# Patient Record
Sex: Female | Born: 1965 | Race: White | Hispanic: No | Marital: Married | Smoking: Never smoker
Health system: Southern US, Community
[De-identification: ages and names within clinical notes are randomized; demographics above are authoritative.]

## PROBLEM LIST (undated history)

## (undated) DIAGNOSIS — K219 Gastro-esophageal reflux disease without esophagitis: Secondary | ICD-10-CM

## (undated) HISTORY — PX: FOOT SURGERY: SHX648

## (undated) HISTORY — PX: CHOLECYSTECTOMY: SHX55

## (undated) HISTORY — PX: OTHER SURGICAL HISTORY: SHX169

## (undated) HISTORY — PX: BREAST SURGERY: SHX581

## (undated) HISTORY — PX: NECK SURGERY: SHX720

---

## 2011-10-24 ENCOUNTER — Emergency Department
Admission: EM | Admit: 2011-10-24 | Discharge: 2011-10-24 | Disposition: A | Discharge status: Home / Self Care | Payer: BC Managed Care – PPO | Source: Home / Self Care | Attending: Family Medicine | Admitting: Family Medicine

## 2011-10-24 DIAGNOSIS — H6691 Otitis media, unspecified, right ear: Secondary | ICD-10-CM

## 2011-10-24 DIAGNOSIS — J069 Acute upper respiratory infection, unspecified: Secondary | ICD-10-CM

## 2011-10-24 DIAGNOSIS — H6690 Otitis media, unspecified, unspecified ear: Secondary | ICD-10-CM

## 2011-10-24 DIAGNOSIS — J0100 Acute maxillary sinusitis, unspecified: Secondary | ICD-10-CM

## 2011-10-24 HISTORY — DX: Gastro-esophageal reflux disease without esophagitis: K21.9

## 2011-10-24 MED ORDER — BENZONATATE 200 MG PO CAPS: 200.0000 mg | ORAL_CAPSULE | Freq: Every day | ORAL | Status: AC

## 2011-10-24 MED ORDER — AMOXICILLIN 875 MG PO TABS: 875.0000 mg | ORAL_TABLET | Freq: Two times a day (BID) | ORAL | Status: AC

## 2011-10-24 NOTE — ED Provider Notes (Signed)
History     CSN: [id number] Arrival date & time: 10/24/2011  2:55 PM   First MD Initiated Contact with Patient 10/24/11 1532      Chief Complaint  Patient presents with  . URI  . Otalgia      HPI Comments: Patient complains of approximately 3 week history of gradually progressive URI symptoms beginning with a mild cough followed by progressive nasal congestion. The cough lasted 2 weeks and she then seemed well.  4 days ago she developed a sore throat that lasted two days, followed by sinus congestion and facial pain.  She has now had a right earache for two days.  Patient is a 45 y.o. female presenting with URI. The history is provided by the patient.  URI The primary symptoms include fatigue, ear pain, cough and myalgias. Primary symptoms do not include fever, headaches, sore throat, swollen glands, wheezing, abdominal pain, nausea, vomiting, arthralgias or rash. The current episode started more than 1 week ago. This is a new problem.  Symptoms associated with the illness include chills, plugged ear sensation, facial pain, sinus pressure, congestion and rhinorrhea.    Past Medical History  Diagnosis Date  . GERD (gastroesophageal reflux disease)     Past Surgical History  Procedure Date  . Breast surgery     Family History  Problem Relation Age of Onset  . Cancer Mother     lung  . Hypertension Mother   . Hyperlipidemia Sister     History  Substance Use Topics  . Smoking status: Never Smoker   . Smokeless tobacco: Not on file  . Alcohol Use: Yes    OB History    Grav Para Term Preterm Abortions TAB SAB Ect Mult Living                  Review of Systems  Constitutional: Positive for chills and fatigue. Negative for fever.  HENT: Positive for ear pain, congestion, rhinorrhea and sinus pressure. Negative for nosebleeds, sore throat and ear discharge.   Eyes: Negative.   Respiratory: Positive for cough. Negative for wheezing.        She reports that cough is  now minimal.  Cardiovascular: Negative.   Gastrointestinal: Negative.  Negative for nausea, vomiting and abdominal pain.  Genitourinary: Negative.   Musculoskeletal: Positive for myalgias. Negative for arthralgias.  Skin: Negative.  Negative for rash.  Neurological: Negative for headaches.    Allergies  Shellfish allergy and Sulfa antibiotics  Home Medications   Current Outpatient Rx  Name Route Sig Dispense Refill  . PANTOPRAZOLE SODIUM 40 MG PO TBEC Oral Take 40 mg by mouth daily.      Marland Kitchen VITAMIN B-12 500 MCG PO TABS Oral Take 500 mcg by mouth daily.      . AMOXICILLIN 875 MG PO TABS Oral Take 1 tablet (875 mg total) by mouth 2 (two) times daily. 20 tablet 0  . BENZONATATE 200 MG PO CAPS Oral Take 1 capsule (200 mg total) by mouth at bedtime. Take as needed for cough 12 capsule 0    BP 153/82  Pulse 81  Temp(Src) 98.3 F (36.8 C) (Oral)  Resp 20  Ht 5' 1.5" (1.562 m)  Wt 173 lb (78.472 kg)  BMI 32.16 kg/m2  SpO2 99%  LMP 10/19/2011  Physical Exam  Nursing note and vitals reviewed. Constitutional: She is oriented to person, place, and time. She appears well-developed and well-nourished. No distress.       Patient is obese (BMI 32.2 )  HENT:  Head: Normocephalic and atraumatic.  Right Ear: External ear and ear canal normal. Tympanic membrane is erythematous.  Left Ear: Tympanic membrane, external ear and ear canal normal.  Nose: Mucosal edema and rhinorrhea present. Right sinus exhibits maxillary sinus tenderness. Left sinus exhibits maxillary sinus tenderness.  Mouth/Throat: Oropharynx is clear and moist. No oral lesions. No oropharyngeal exudate.  Eyes: Right eye exhibits no discharge. Left eye exhibits no discharge. No scleral icterus.  Neck: Neck supple.  Cardiovascular: Normal rate, regular rhythm and normal heart sounds.   Pulmonary/Chest: Effort normal and breath sounds normal. She has no wheezes. She has no rales.  Lymphadenopathy:    She has no cervical  adenopathy.  Neurological: She is alert and oriented to person, place, and time.  Skin: Skin is warm and dry.    ED Course  Procedures None      1. Otitis media of right ear   2. Acute maxillary sinusitis   3. Acute upper respiratory infections of unspecified site       MDM  Suspect viral URI with secondary bacterial otitis media and sinusitis Begin Amoxicillin.  Begin expectorant/decongestant, topical decongestant, saline nasal spray and/or saline irrigation, and cough suppressant at bedtime.  Avoid antihistamines for now. Increase fluid intake, rest. May take Ibuprofen 200mg , 4 tabs every 8 hours with food for discomfort. Followup with PCP if not improving 7 days.         Donna Christen, MD 10/27/11 [phone]

## 2011-10-24 NOTE — ED Notes (Signed)
States upper respiratory symptoms started 14 days ago, on Tuesday started with sinus pressure and right ear pain

## 2017-08-09 ENCOUNTER — Inpatient Hospital Stay
Admission: AD | Admit: 2017-08-09 | Discharge: 2017-08-27 | Disposition: A | Discharge status: Rehabilitation Facility | Payer: Self-pay | Source: Other Acute Inpatient Hospital | Attending: Internal Medicine | Admitting: Internal Medicine

## 2017-08-09 DIAGNOSIS — T85598A Other mechanical complication of other gastrointestinal prosthetic devices, implants and grafts, initial encounter: Secondary | ICD-10-CM

## 2017-08-09 DIAGNOSIS — Z4659 Encounter for fitting and adjustment of other gastrointestinal appliance and device: Secondary | ICD-10-CM

## 2017-08-09 DIAGNOSIS — R509 Fever, unspecified: Secondary | ICD-10-CM

## 2017-08-10 ENCOUNTER — Other Ambulatory Visit (HOSPITAL_COMMUNITY): Payer: Self-pay

## 2017-08-10 LAB — BASIC METABOLIC PANEL
Anion gap: 10 (ref 5–15)
BUN: 24 mg/dL — AB (ref 6–20)
CO2: 23 mmol/L (ref 22–32)
Calcium: 9.1 mg/dL (ref 8.9–10.3)
Chloride: 105 mmol/L (ref 101–111)
Creatinine, Ser: 0.51 mg/dL (ref 0.44–1.00)
GFR calc Af Amer: >60 mL/min (ref >60)
GFR calc non Af Amer: >60 mL/min (ref >60)
GLUCOSE: 94 mg/dL (ref 65–99)
POTASSIUM: 4 mmol/L (ref 3.5–5.1)
Sodium: 138 mmol/L (ref 135–145)

## 2017-08-10 LAB — CBC
HCT: 29.2 % — ABNORMAL LOW (ref 36.0–46.0)
HEMOGLOBIN: 8.9 g/dL — AB (ref 12.0–15.0)
MCH: 29.9 pg (ref 26.0–34.0)
MCHC: 30.5 g/dL (ref 30.0–36.0)
MCV: 98 fL (ref 78.0–100.0)
Platelets: 466 10*3/uL — ABNORMAL HIGH (ref 150–400)
RBC: 2.98 MIL/uL — AB (ref 3.87–5.11)
RDW: 14.4 % (ref 11.5–15.5)
WBC: 7.5 10*3/uL (ref 4.0–10.5)

## 2017-08-10 IMAGING — DX DG ABD PORTABLE 1V
2 series · 2 of 2 positions shown · non-contrast
Comparison: None.

CLINICAL DATA: Impaired nasogastric feeding tube

EXAM:
PORTABLE ABDOMEN - 1 VIEW

[abdomen kub (1 of 2)]
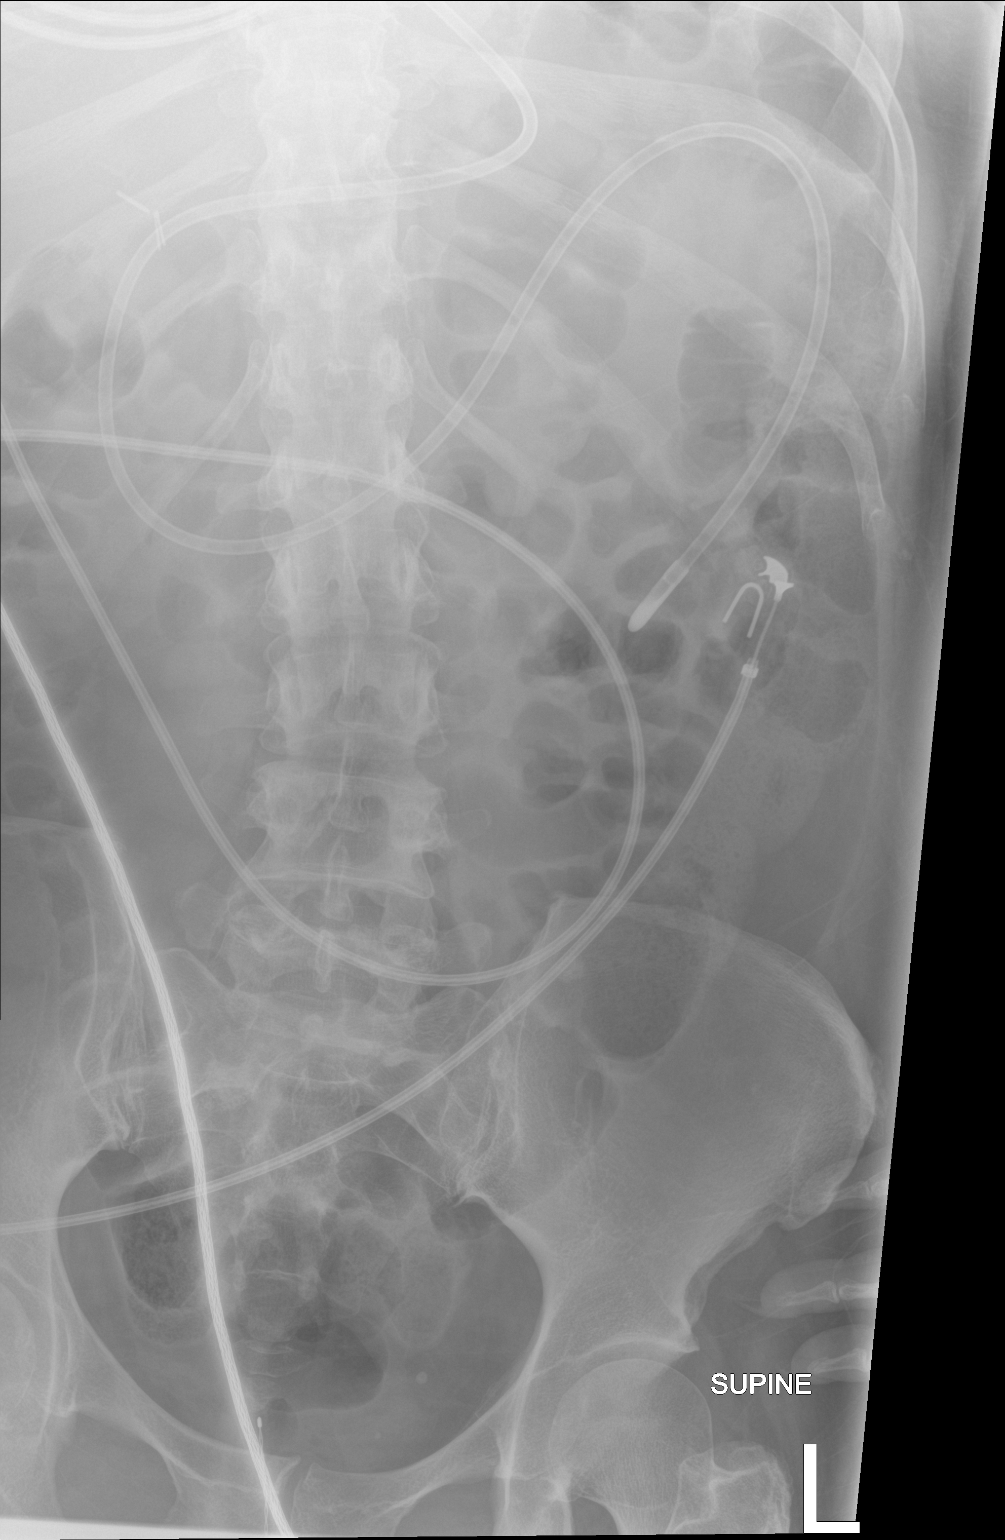

[abdomen kub (2 of 2)]
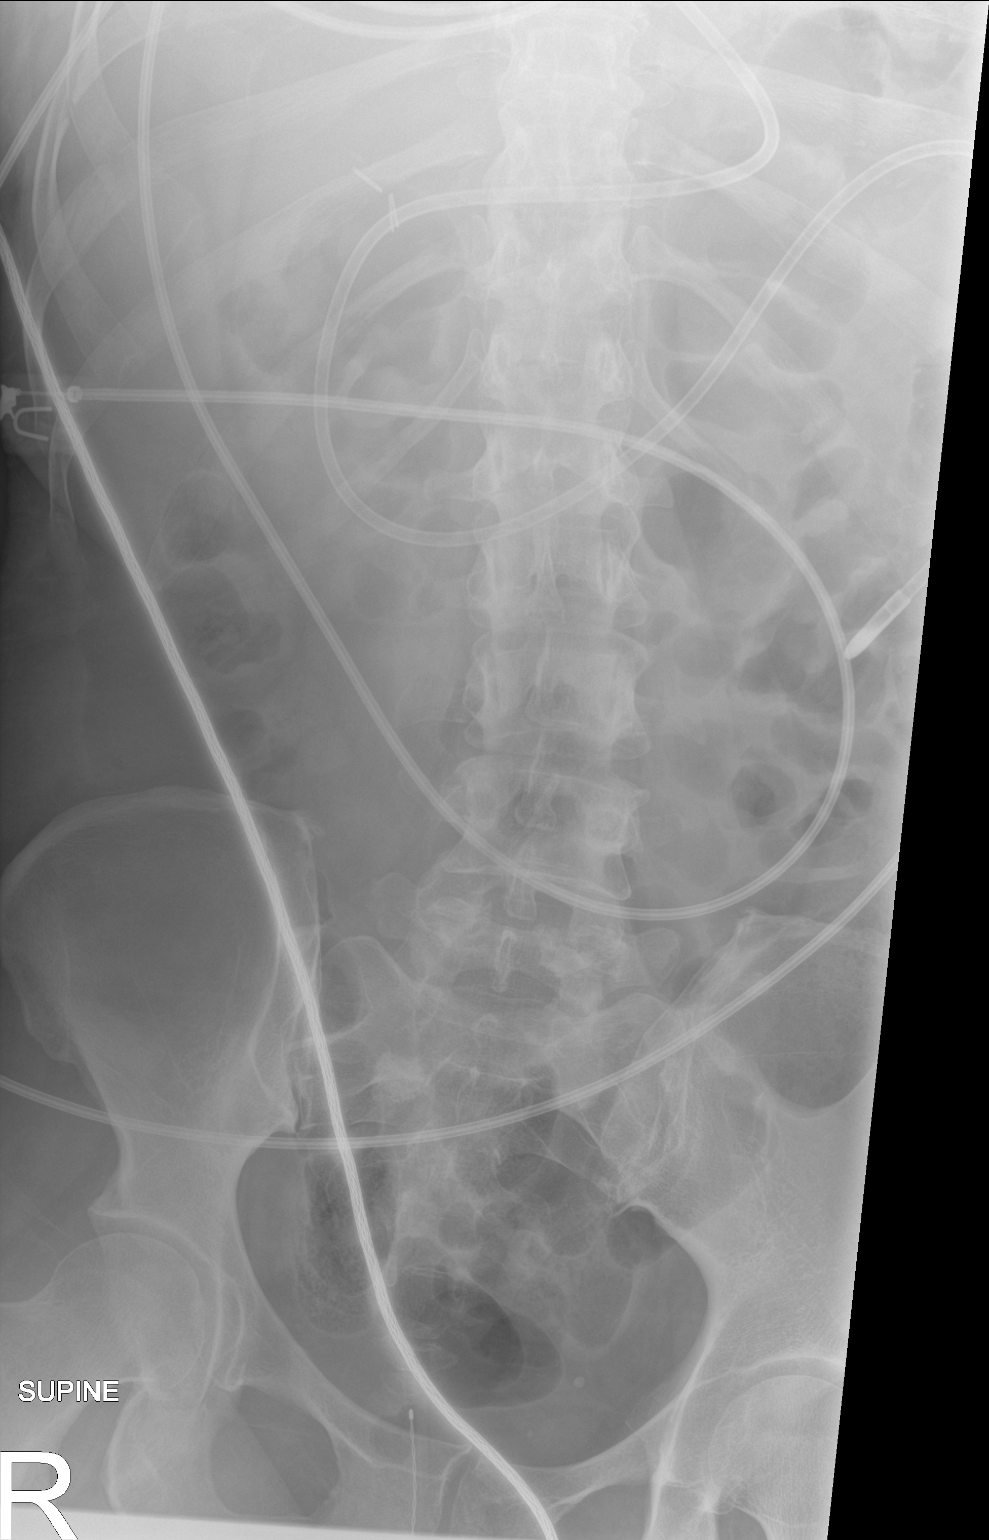

[2 of 2 positions shown; findings below may reference images not displayed]

FINDINGS: Enteric feeding tube is present with tip in the left mid abdomen
consistent with location in the proximal jejunum. Gas-filled colon
without distention. No small bowel distention. No radiopaque stones.
Surgical clips in the right upper quadrant.
IMPRESSION: Enteric feeding tube tip is in the left mid abdomen consistent with
location in the proximal jejunum.

## 2017-08-12 ENCOUNTER — Other Ambulatory Visit (HOSPITAL_COMMUNITY): Payer: Self-pay

## 2017-08-12 LAB — URINALYSIS, ROUTINE W REFLEX MICROSCOPIC
Bilirubin Urine: NEGATIVE
Glucose, UA: NEGATIVE mg/dL
Ketones, ur: NEGATIVE mg/dL
NITRITE: NEGATIVE
Protein, ur: 100 mg/dL — AB
Specific Gravity, Urine: 1.029 (ref 1.005–1.030)
pH: 5 (ref 5.0–8.0)

## 2017-08-12 IMAGING — DX DG CHEST 1V PORT
1 series · 1 of 1 positions shown · non-contrast
Comparison: None.

CLINICAL DATA: Fever.

EXAM:
PORTABLE CHEST 1 VIEW

[chest ap]
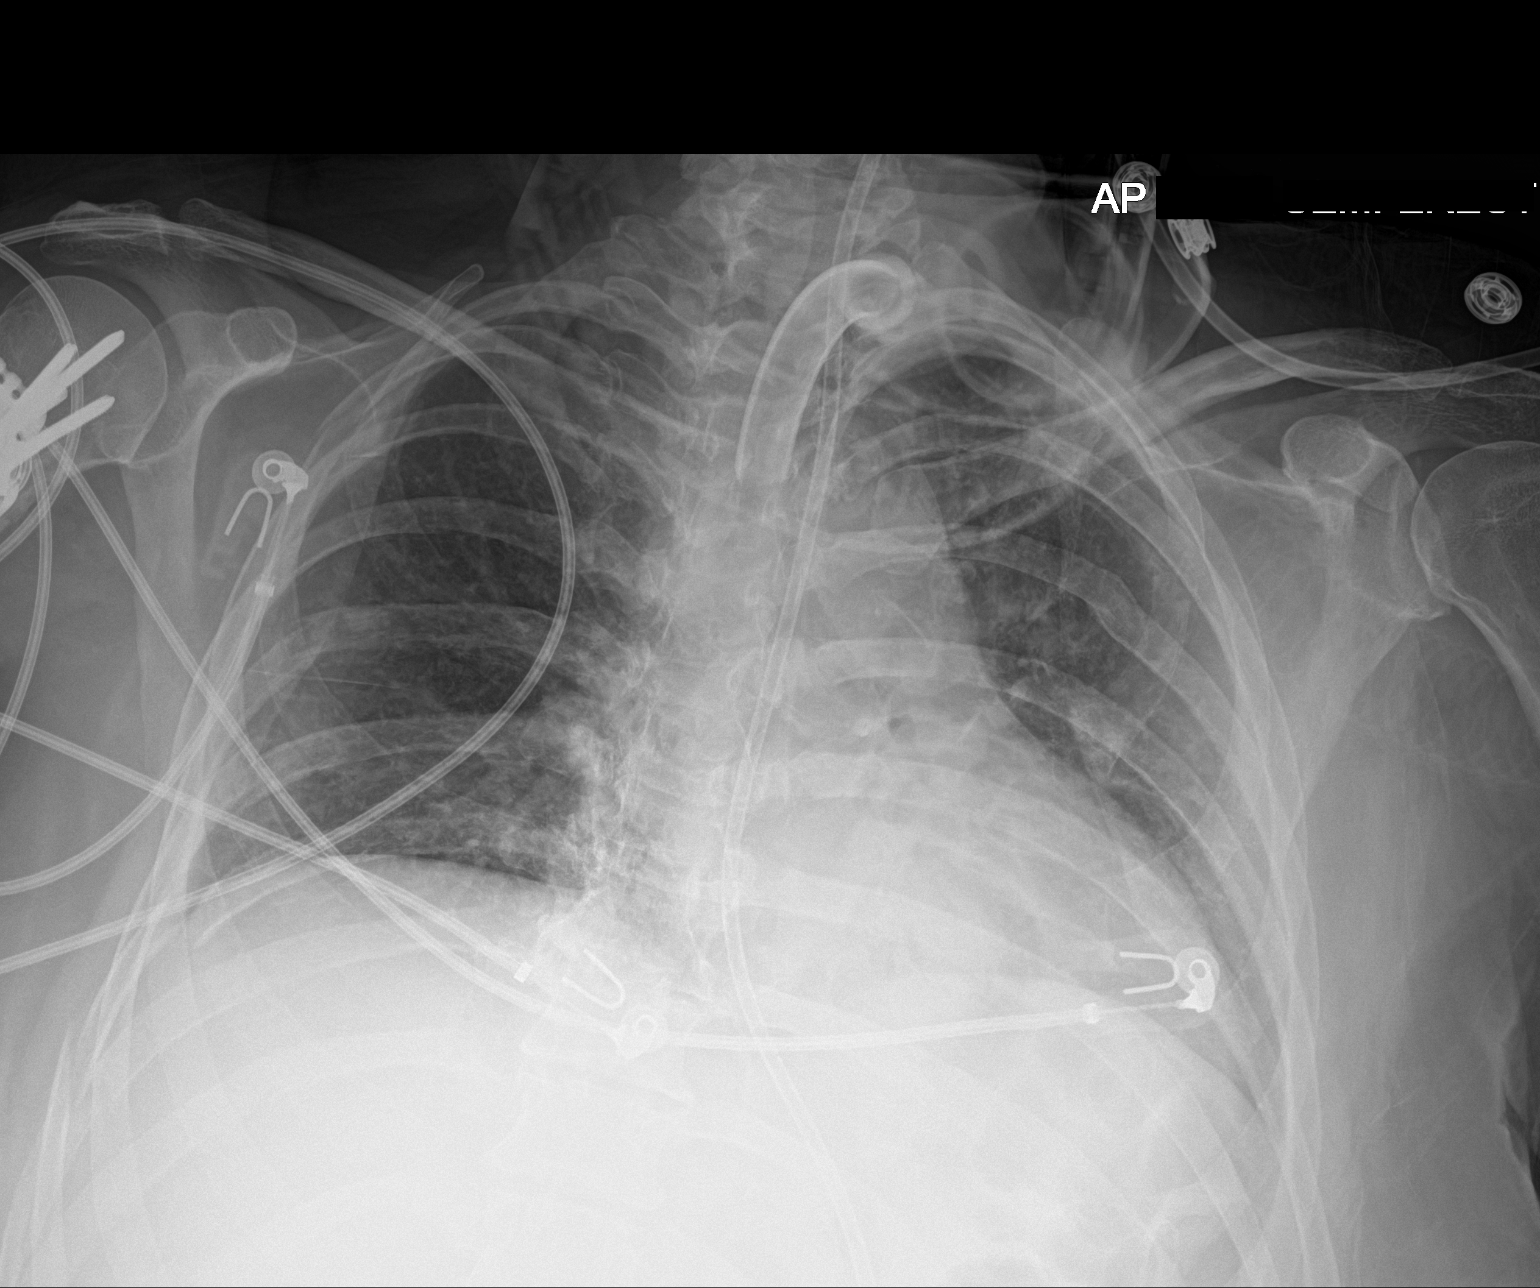

[1 of 1 positions shown; findings below may reference images not displayed]

FINDINGS: Tracheostomy tube tip projects 2.6 cm above the carina. Feeding tube
past proximal stomach, distal tip not included. Cardiomediastinal
silhouette is unremarkable for this low inspiratory examination with
crowded vasculature markings. Retrocardiac consolidation with air
bronchograms. Trace LEFT pleural effusion. Trachea projects midline
and there is no pneumothorax. Numerous acute mildly displaced RIGHT
rib fractures. Old LEFT rib fractures. RIGHT humerus ORIF.
IMPRESSION: Retrocardiac atelectasis or pneumonia. Trace LEFT pleural effusion
suspected.

Multiple acute RIGHT rib fractures.

## 2017-08-13 LAB — BASIC METABOLIC PANEL
ANION GAP: 7 (ref 5–15)
BUN: 17 mg/dL (ref 6–20)
CHLORIDE: 107 mmol/L (ref 101–111)
CO2: 23 mmol/L (ref 22–32)
CREATININE: 0.44 mg/dL (ref 0.44–1.00)
Calcium: 8.3 mg/dL — ABNORMAL LOW (ref 8.9–10.3)
GFR calc Af Amer: >60 mL/min (ref >60)
Glucose, Bld: 114 mg/dL — ABNORMAL HIGH (ref 65–99)
POTASSIUM: 3.4 mmol/L — AB (ref 3.5–5.1)
SODIUM: 137 mmol/L (ref 135–145)

## 2017-08-13 LAB — CBC
HEMATOCRIT: 26.9 % — AB (ref 36.0–46.0)
Hemoglobin: 8.3 g/dL — ABNORMAL LOW (ref 12.0–15.0)
MCH: 29.9 pg (ref 26.0–34.0)
MCHC: 30.9 g/dL (ref 30.0–36.0)
MCV: 96.8 fL (ref 78.0–100.0)
Platelets: 417 10*3/uL — ABNORMAL HIGH (ref 150–400)
RBC: 2.78 MIL/uL — AB (ref 3.87–5.11)
RDW: 14.7 % (ref 11.5–15.5)
WBC: 9.6 10*3/uL (ref 4.0–10.5)

## 2017-08-14 LAB — POTASSIUM: POTASSIUM: 4 mmol/L (ref 3.5–5.1)

## 2017-08-14 LAB — VANCOMYCIN, TROUGH: VANCOMYCIN TR: 7 ug/mL — AB (ref 15–20)

## 2017-08-15 LAB — URINE CULTURE: Culture: >=100000 — AB

## 2017-08-15 LAB — CULTURE, RESPIRATORY

## 2017-08-15 LAB — CULTURE, RESPIRATORY W GRAM STAIN

## 2017-08-15 LAB — VANCOMYCIN, TROUGH: VANCOMYCIN TR: 11 ug/mL — AB (ref 15–20)

## 2017-08-16 ENCOUNTER — Other Ambulatory Visit (HOSPITAL_COMMUNITY): Payer: Self-pay

## 2017-08-17 ENCOUNTER — Other Ambulatory Visit (HOSPITAL_COMMUNITY): Payer: Self-pay

## 2017-08-17 LAB — BASIC METABOLIC PANEL
Anion gap: 9 (ref 5–15)
BUN: 11 mg/dL (ref 6–20)
CALCIUM: 8.9 mg/dL (ref 8.9–10.3)
CO2: 22 mmol/L (ref 22–32)
CREATININE: 0.44 mg/dL (ref 0.44–1.00)
Chloride: 109 mmol/L (ref 101–111)
GFR calc non Af Amer: >60 mL/min (ref >60)
GLUCOSE: 86 mg/dL (ref 65–99)
Potassium: 3.3 mmol/L — ABNORMAL LOW (ref 3.5–5.1)
Sodium: 140 mmol/L (ref 135–145)

## 2017-08-17 LAB — CULTURE, BLOOD (ROUTINE X 2)
CULTURE: NO GROWTH
Culture: NO GROWTH
SPECIAL REQUESTS: ADEQUATE
Special Requests: ADEQUATE

## 2017-08-17 LAB — VANCOMYCIN, TROUGH: Vancomycin Tr: <4 ug/mL — ABNORMAL LOW (ref 15–20)

## 2017-08-17 LAB — CBC
HEMATOCRIT: 29.8 % — AB (ref 36.0–46.0)
Hemoglobin: 9.5 g/dL — ABNORMAL LOW (ref 12.0–15.0)
MCH: 30.4 pg (ref 26.0–34.0)
MCHC: 31.9 g/dL (ref 30.0–36.0)
MCV: 95.5 fL (ref 78.0–100.0)
Platelets: 482 10*3/uL — ABNORMAL HIGH (ref 150–400)
RBC: 3.12 MIL/uL — ABNORMAL LOW (ref 3.87–5.11)
RDW: 14.7 % (ref 11.5–15.5)
WBC: 7.4 10*3/uL (ref 4.0–10.5)

## 2017-08-17 LAB — MAGNESIUM: Magnesium: 1.8 mg/dL (ref 1.7–2.4)

## 2017-08-17 LAB — PHOSPHORUS: PHOSPHORUS: 3.9 mg/dL (ref 2.5–4.6)

## 2017-08-17 IMAGING — RF DG SWALLOWING FUNCTION - NRPT [facility]
1 series · 18 of 24 positions shown · non-contrast
Comparison: none

[Series 1: run · 13 acquisitions, 18 frames shown]
[im 1/13]
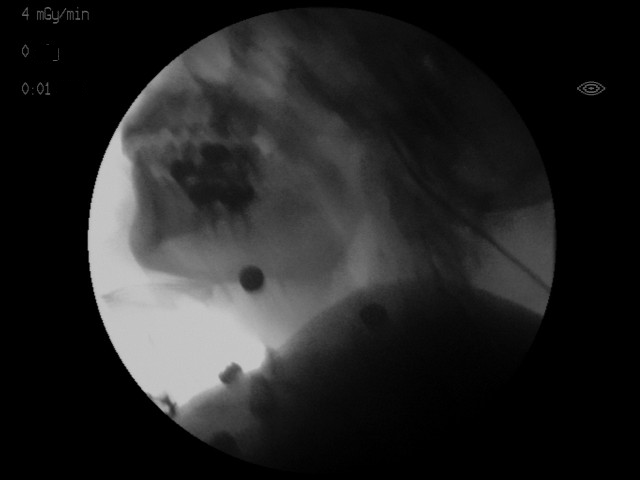
[im 2/13]
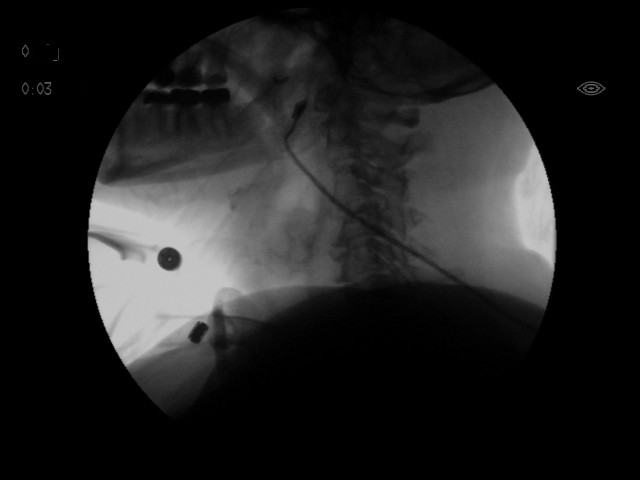
[im 2/13]
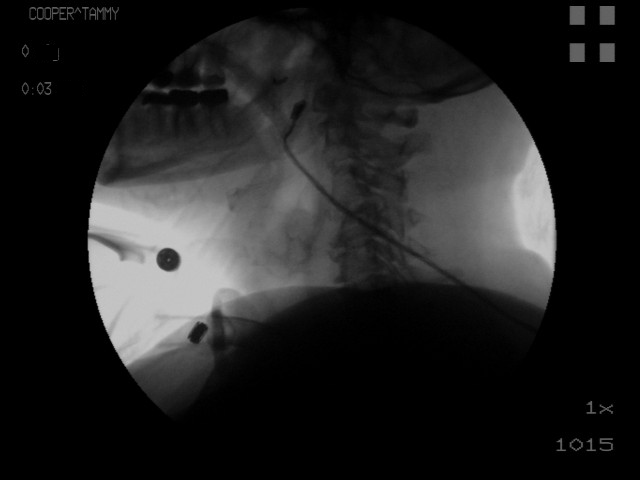
[im 3/13]
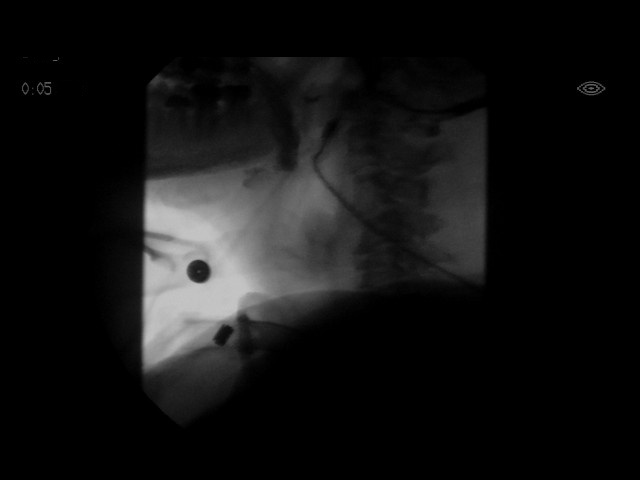
[im 4/13]
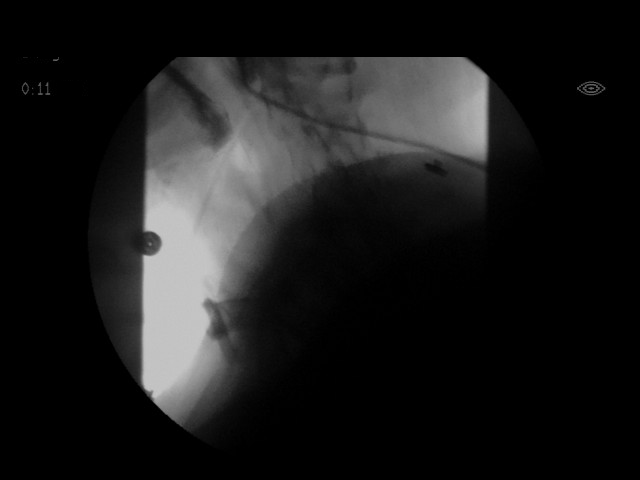
[im 5/13]
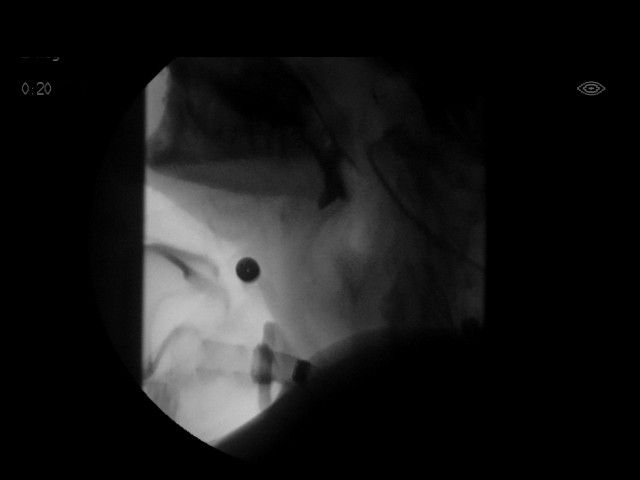
[im 5/13]
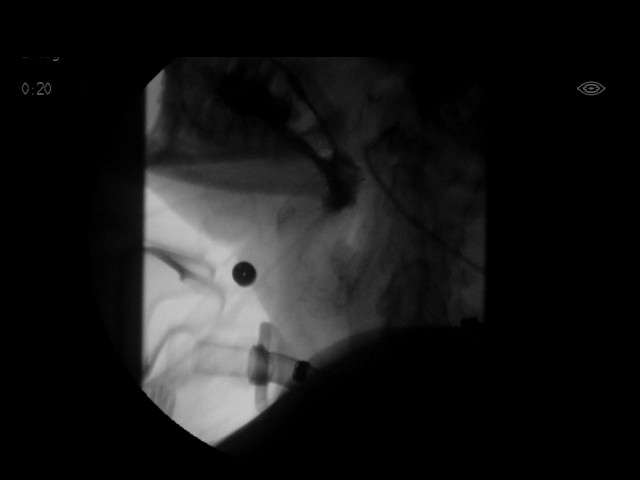
[im 6/13]
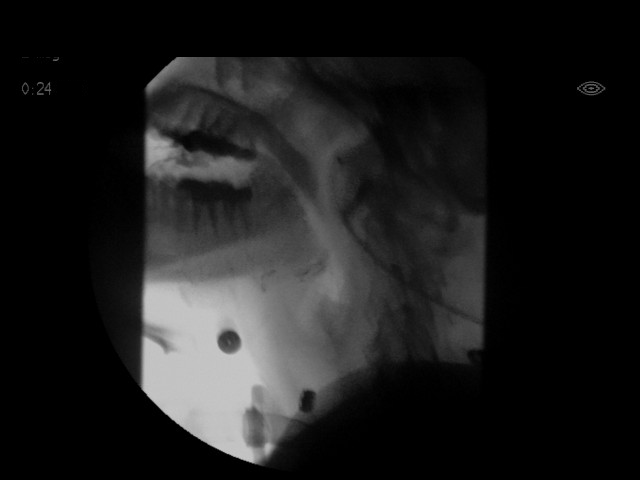
[im 7/13]
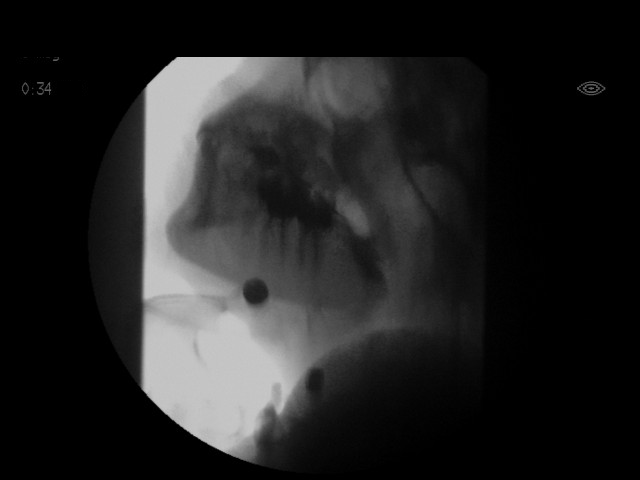
[im 7/13]
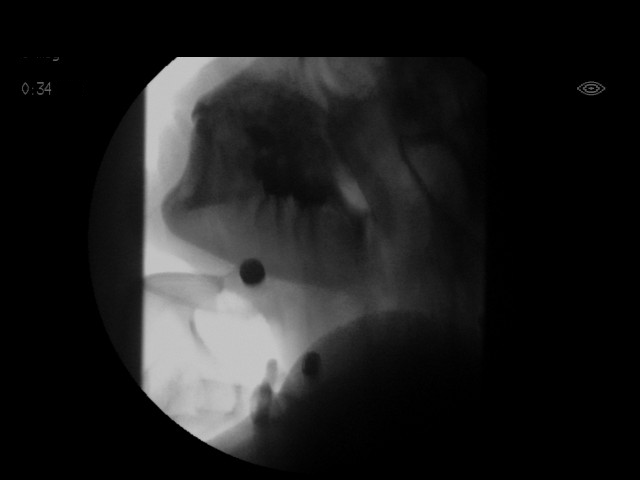
[im 8/13]
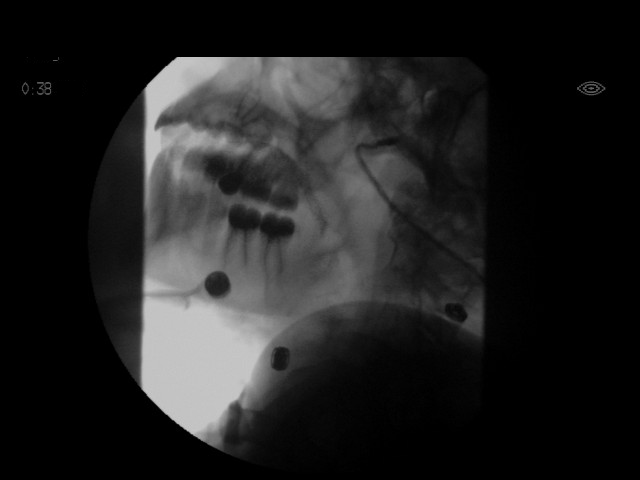
[im 9/13]
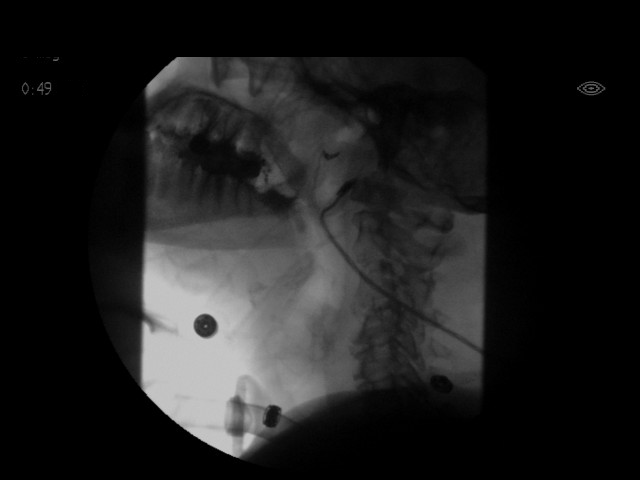
[im 9/13]
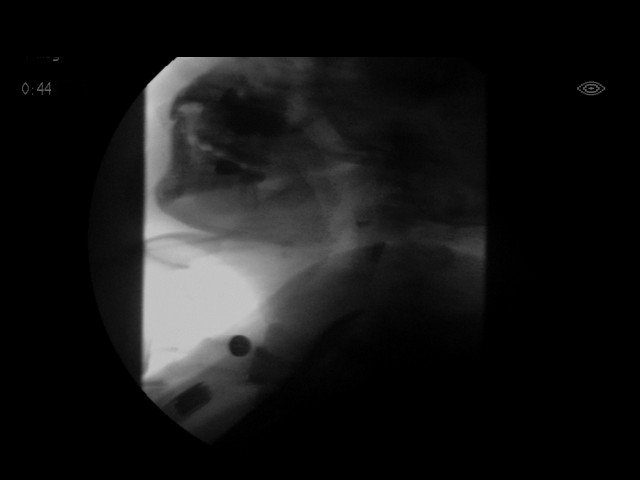
[im 11/13]
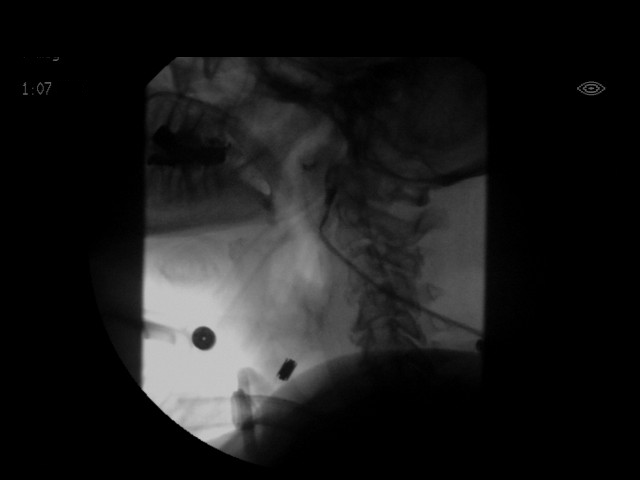
[im 11/13]
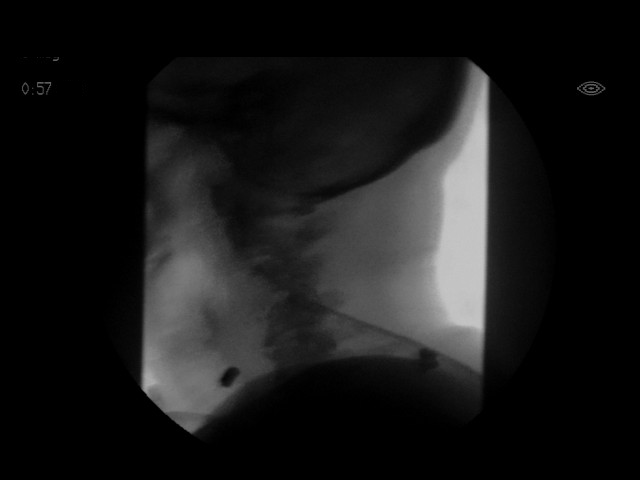
[im 12/13]
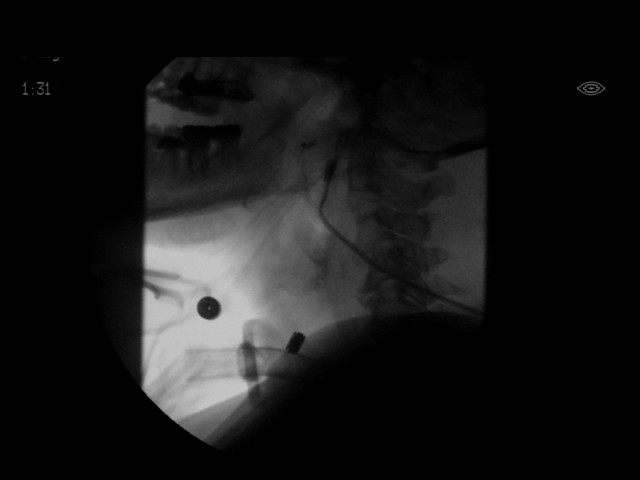
[im 13/13]
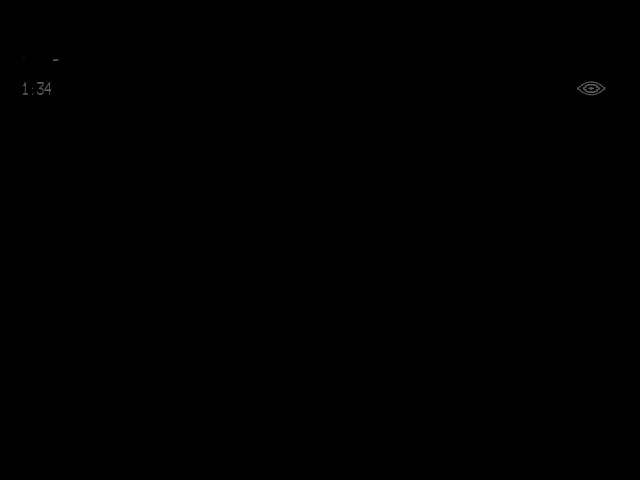
[im 13/13]
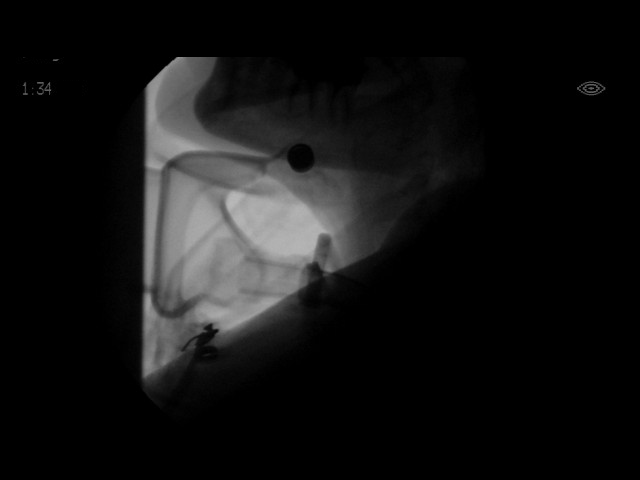

[18 of 24 positions shown; findings below may reference images not displayed]

FLUOROSCOPY FOR SWALLOWING FUNCTION STUDY:
Fluoroscopy was provided for swallowing function study, which was administered by a speech pathologist.  Final results and recommendations from this study are contained within the speech pathology report.

## 2017-08-18 LAB — POTASSIUM: Potassium: 3.7 mmol/L (ref 3.5–5.1)

## 2017-08-24 LAB — CBC
HEMATOCRIT: 36.7 % (ref 36.0–46.0)
HEMOGLOBIN: 11.6 g/dL — AB (ref 12.0–15.0)
MCH: 30.5 pg (ref 26.0–34.0)
MCHC: 31.6 g/dL (ref 30.0–36.0)
MCV: 96.6 fL (ref 78.0–100.0)
PLATELETS: 374 10*3/uL (ref 150–400)
RBC: 3.8 MIL/uL — AB (ref 3.87–5.11)
RDW: 15.7 % — AB (ref 11.5–15.5)
WBC: 7.8 10*3/uL (ref 4.0–10.5)

## 2017-08-24 LAB — BASIC METABOLIC PANEL
Anion gap: 13 (ref 5–15)
BUN: 9 mg/dL (ref 6–20)
CALCIUM: 9.3 mg/dL (ref 8.9–10.3)
CO2: 17 mmol/L — AB (ref 22–32)
CREATININE: 0.45 mg/dL (ref 0.44–1.00)
Chloride: 109 mmol/L (ref 101–111)
GFR calc Af Amer: >60 mL/min (ref >60)
GFR calc non Af Amer: >60 mL/min (ref >60)
GLUCOSE: 85 mg/dL (ref 65–99)
Potassium: 4.2 mmol/L (ref 3.5–5.1)
Sodium: 139 mmol/L (ref 135–145)

## 2017-11-30 HISTORY — PX: COLONOSCOPY: SHX174

## 2020-05-30 ENCOUNTER — Ambulatory Visit: Payer: Medicare HMO | Admitting: Podiatry

## 2020-08-21 ENCOUNTER — Other Ambulatory Visit: Payer: Self-pay

## 2020-08-21 ENCOUNTER — Encounter (HOSPITAL_COMMUNITY): Payer: Self-pay | Admitting: Emergency Medicine

## 2020-08-21 ENCOUNTER — Emergency Department (HOSPITAL_COMMUNITY): Payer: Medicare HMO

## 2020-08-21 ENCOUNTER — Inpatient Hospital Stay (HOSPITAL_COMMUNITY)
Admission: EM | Admit: 2020-08-21 | Discharge: 2020-08-28 | DRG: 981 | Disposition: A | Discharge status: Home / Self Care | Payer: Medicare HMO | Attending: Internal Medicine | Admitting: Internal Medicine

## 2020-08-21 DIAGNOSIS — Z8249 Family history of ischemic heart disease and other diseases of the circulatory system: Secondary | ICD-10-CM

## 2020-08-21 DIAGNOSIS — K922 Gastrointestinal hemorrhage, unspecified: Secondary | ICD-10-CM | POA: Diagnosis not present

## 2020-08-21 DIAGNOSIS — D62 Acute posthemorrhagic anemia: Secondary | ICD-10-CM | POA: Diagnosis not present

## 2020-08-21 DIAGNOSIS — R29810 Facial weakness: Secondary | ICD-10-CM | POA: Diagnosis present

## 2020-08-21 DIAGNOSIS — S069XAA Unspecified intracranial injury with loss of consciousness status unknown, initial encounter: Secondary | ICD-10-CM

## 2020-08-21 DIAGNOSIS — R824 Acetonuria: Secondary | ICD-10-CM | POA: Diagnosis present

## 2020-08-21 DIAGNOSIS — M545 Low back pain: Secondary | ICD-10-CM | POA: Diagnosis present

## 2020-08-21 DIAGNOSIS — D509 Iron deficiency anemia, unspecified: Secondary | ICD-10-CM | POA: Diagnosis present

## 2020-08-21 DIAGNOSIS — Z91041 Radiographic dye allergy status: Secondary | ICD-10-CM

## 2020-08-21 DIAGNOSIS — W19XXXA Unspecified fall, initial encounter: Secondary | ICD-10-CM | POA: Diagnosis present

## 2020-08-21 DIAGNOSIS — R8271 Bacteriuria: Secondary | ICD-10-CM | POA: Diagnosis present

## 2020-08-21 DIAGNOSIS — M79672 Pain in left foot: Secondary | ICD-10-CM | POA: Diagnosis present

## 2020-08-21 DIAGNOSIS — G92 Toxic encephalopathy: Secondary | ICD-10-CM | POA: Diagnosis present

## 2020-08-21 DIAGNOSIS — Z20822 Contact with and (suspected) exposure to covid-19: Secondary | ICD-10-CM | POA: Diagnosis present

## 2020-08-21 DIAGNOSIS — Z79899 Other long term (current) drug therapy: Secondary | ICD-10-CM

## 2020-08-21 DIAGNOSIS — T48205A Adverse effect of unspecified drugs acting on muscles, initial encounter: Secondary | ICD-10-CM | POA: Diagnosis present

## 2020-08-21 DIAGNOSIS — R413 Other amnesia: Secondary | ICD-10-CM | POA: Diagnosis present

## 2020-08-21 DIAGNOSIS — G629 Polyneuropathy, unspecified: Secondary | ICD-10-CM | POA: Diagnosis present

## 2020-08-21 DIAGNOSIS — K2970 Gastritis, unspecified, without bleeding: Secondary | ICD-10-CM | POA: Diagnosis present

## 2020-08-21 DIAGNOSIS — Z6832 Body mass index (BMI) 32.0-32.9, adult: Secondary | ICD-10-CM

## 2020-08-21 DIAGNOSIS — I639 Cerebral infarction, unspecified: Secondary | ICD-10-CM

## 2020-08-21 DIAGNOSIS — G8929 Other chronic pain: Secondary | ICD-10-CM | POA: Diagnosis present

## 2020-08-21 DIAGNOSIS — S22080A Wedge compression fracture of T11-T12 vertebra, initial encounter for closed fracture: Secondary | ICD-10-CM | POA: Diagnosis present

## 2020-08-21 DIAGNOSIS — S069X9A Unspecified intracranial injury with loss of consciousness of unspecified duration, initial encounter: Secondary | ICD-10-CM

## 2020-08-21 DIAGNOSIS — E669 Obesity, unspecified: Secondary | ICD-10-CM | POA: Diagnosis present

## 2020-08-21 DIAGNOSIS — Z791 Long term (current) use of non-steroidal anti-inflammatories (NSAID): Secondary | ICD-10-CM

## 2020-08-21 DIAGNOSIS — K449 Diaphragmatic hernia without obstruction or gangrene: Secondary | ICD-10-CM | POA: Diagnosis present

## 2020-08-21 DIAGNOSIS — R4701 Aphasia: Secondary | ICD-10-CM

## 2020-08-21 DIAGNOSIS — M7918 Myalgia, other site: Secondary | ICD-10-CM | POA: Diagnosis present

## 2020-08-21 DIAGNOSIS — S22000A Wedge compression fracture of unspecified thoracic vertebra, initial encounter for closed fracture: Secondary | ICD-10-CM

## 2020-08-21 DIAGNOSIS — M4854XA Collapsed vertebra, not elsewhere classified, thoracic region, initial encounter for fracture: Secondary | ICD-10-CM | POA: Diagnosis present

## 2020-08-21 DIAGNOSIS — Z809 Family history of malignant neoplasm, unspecified: Secondary | ICD-10-CM

## 2020-08-21 DIAGNOSIS — Z8719 Personal history of other diseases of the digestive system: Secondary | ICD-10-CM

## 2020-08-21 DIAGNOSIS — K219 Gastro-esophageal reflux disease without esophagitis: Secondary | ICD-10-CM | POA: Diagnosis present

## 2020-08-21 DIAGNOSIS — Z9049 Acquired absence of other specified parts of digestive tract: Secondary | ICD-10-CM

## 2020-08-21 DIAGNOSIS — Z83438 Family history of other disorder of lipoprotein metabolism and other lipidemia: Secondary | ICD-10-CM

## 2020-08-21 DIAGNOSIS — S065X9S Traumatic subdural hemorrhage with loss of consciousness of unspecified duration, sequela: Secondary | ICD-10-CM

## 2020-08-21 DIAGNOSIS — T402X5A Adverse effect of other opioids, initial encounter: Secondary | ICD-10-CM | POA: Diagnosis present

## 2020-08-21 HISTORY — DX: Unspecified intracranial injury with loss of consciousness of unspecified duration, initial encounter: S06.9X9A

## 2020-08-21 HISTORY — DX: Unspecified intracranial injury with loss of consciousness status unknown, initial encounter: S06.9XAA

## 2020-08-21 LAB — CBC WITH DIFFERENTIAL/PLATELET
Abs Immature Granulocytes: 0.06 10*3/uL (ref 0.00–0.07)
Basophils Absolute: 0 10*3/uL (ref 0.0–0.1)
Basophils Relative: 0 %
Eosinophils Absolute: 0 10*3/uL (ref 0.0–0.5)
Eosinophils Relative: 0 %
HCT: 16.1 % — ABNORMAL LOW (ref 36.0–46.0)
Hemoglobin: 3.7 g/dL — CL (ref 12.0–15.0)
Immature Granulocytes: 1 %
Lymphocytes Relative: 10 %
Lymphs Abs: 0.8 10*3/uL (ref 0.7–4.0)
MCH: 17.1 pg — ABNORMAL LOW (ref 26.0–34.0)
MCHC: 23 g/dL — ABNORMAL LOW (ref 30.0–36.0)
MCV: 74.2 fL — ABNORMAL LOW (ref 80.0–100.0)
Monocytes Absolute: 0.2 10*3/uL (ref 0.1–1.0)
Monocytes Relative: 2 %
Neutro Abs: 6.9 10*3/uL (ref 1.7–7.7)
Neutrophils Relative %: 87 %
Platelets: UNDETERMINED 10*3/uL (ref 150–400)
RBC: 2.17 MIL/uL — ABNORMAL LOW (ref 3.87–5.11)
RDW: 21.1 % — ABNORMAL HIGH (ref 11.5–15.5)
WBC: 8 10*3/uL (ref 4.0–10.5)
nRBC: 0.5 % — ABNORMAL HIGH (ref 0.0–0.2)

## 2020-08-21 LAB — CBG MONITORING, ED: Glucose-Capillary: 139 mg/dL — ABNORMAL HIGH (ref 70–99)

## 2020-08-21 LAB — COMPREHENSIVE METABOLIC PANEL
ALT: 26 U/L (ref 0–44)
AST: 19 U/L (ref 15–41)
Albumin: 3.6 g/dL (ref 3.5–5.0)
Alkaline Phosphatase: 60 U/L (ref 38–126)
Anion gap: 11 (ref 5–15)
BUN: 17 mg/dL (ref 6–20)
CO2: 19 mmol/L — ABNORMAL LOW (ref 22–32)
Calcium: 8.8 mg/dL — ABNORMAL LOW (ref 8.9–10.3)
Chloride: 108 mmol/L (ref 98–111)
Creatinine, Ser: 0.59 mg/dL (ref 0.44–1.00)
GFR calc Af Amer: >60 mL/min (ref >60)
GFR calc non Af Amer: >60 mL/min (ref >60)
Glucose, Bld: 139 mg/dL — ABNORMAL HIGH (ref 70–99)
Potassium: 3.5 mmol/L (ref 3.5–5.1)
Sodium: 138 mmol/L (ref 135–145)
Total Bilirubin: 0.7 mg/dL (ref 0.3–1.2)
Total Protein: 7.4 g/dL (ref 6.5–8.1)

## 2020-08-21 LAB — DIFFERENTIAL
Abs Immature Granulocytes: 0.06 10*3/uL (ref 0.00–0.07)
Basophils Absolute: 0 10*3/uL (ref 0.0–0.1)
Basophils Relative: 0 %
Eosinophils Absolute: 0 10*3/uL (ref 0.0–0.5)
Eosinophils Relative: 0 %
Immature Granulocytes: 1 %
Lymphocytes Relative: 9 %
Lymphs Abs: 0.7 10*3/uL (ref 0.7–4.0)
Monocytes Absolute: 0.1 10*3/uL (ref 0.1–1.0)
Monocytes Relative: 2 %
Neutro Abs: 7 10*3/uL (ref 1.7–7.7)
Neutrophils Relative %: 88 %

## 2020-08-21 LAB — POC URINE PREG, ED: Preg Test, Ur: NEGATIVE

## 2020-08-21 LAB — APTT: aPTT: 29 seconds (ref 24–36)

## 2020-08-21 LAB — ABO/RH: ABO/RH(D): O POS

## 2020-08-21 LAB — PROTIME-INR
INR: 1.1 (ref 0.8–1.2)
Prothrombin Time: 14.1 seconds (ref 11.4–15.2)

## 2020-08-21 LAB — CBC
HCT: 15.3 % — ABNORMAL LOW (ref 36.0–46.0)
Hemoglobin: 3.7 g/dL — CL (ref 12.0–15.0)
MCH: 17.5 pg — ABNORMAL LOW (ref 26.0–34.0)
MCHC: 24.2 g/dL — ABNORMAL LOW (ref 30.0–36.0)
MCV: 72.2 fL — ABNORMAL LOW (ref 80.0–100.0)
Platelets: 363 10*3/uL (ref 150–400)
RBC: 2.12 MIL/uL — ABNORMAL LOW (ref 3.87–5.11)
RDW: 21.4 % — ABNORMAL HIGH (ref 11.5–15.5)
WBC: 7.9 10*3/uL (ref 4.0–10.5)
nRBC: 0.6 % — ABNORMAL HIGH (ref 0.0–0.2)

## 2020-08-21 LAB — PREPARE RBC (CROSSMATCH)

## 2020-08-21 LAB — ETHANOL: Alcohol, Ethyl (B): <10 mg/dL (ref <10)

## 2020-08-21 LAB — SARS CORONAVIRUS 2 BY RT PCR (HOSPITAL ORDER, PERFORMED IN ~~LOC~~ HOSPITAL LAB): SARS Coronavirus 2: NEGATIVE

## 2020-08-21 IMAGING — CT CT HEAD CODE STROKE
3 series · 14 of 47 positions shown, 16 images · non-contrast
Comparison: None available.

CLINICAL DATA: Code stroke. Initial evaluation for acute altered
mental status, confusion.

EXAM:
CT HEAD WITHOUT CONTRAST
TECHNIQUE: Contiguous axial images were obtained from the base of the skull
through the vertex without intravenous contrast.

[Series 2: head w o · axial · 0.47mm/px · z∈[+1682,+1807]mm · 8 of 30 slices shown, 10 images]
[im 3/30  brain]
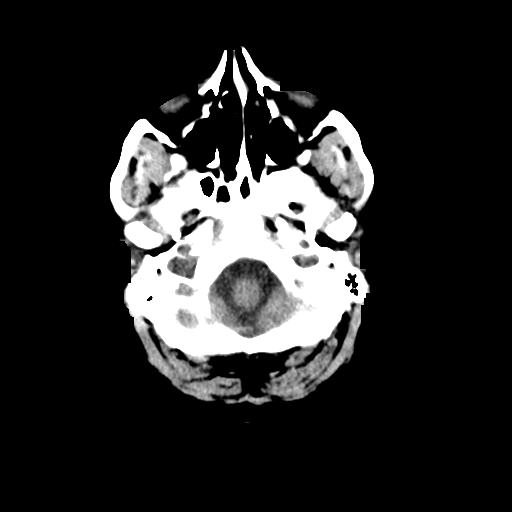
[im 3/30  bone]
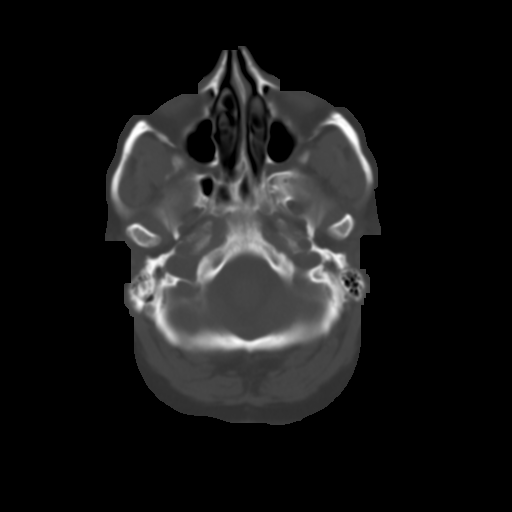
[im 7/30  brain]
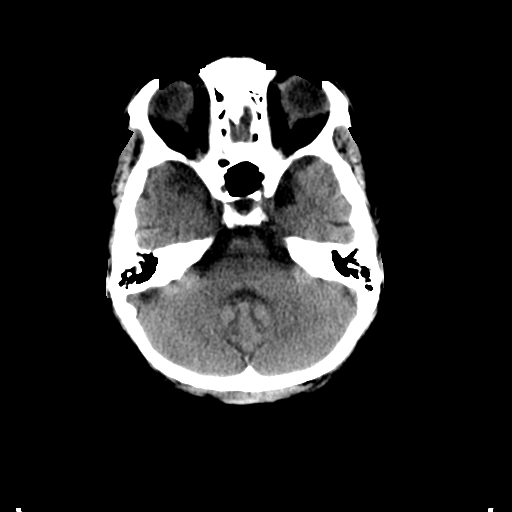
[im 10/30  brain]
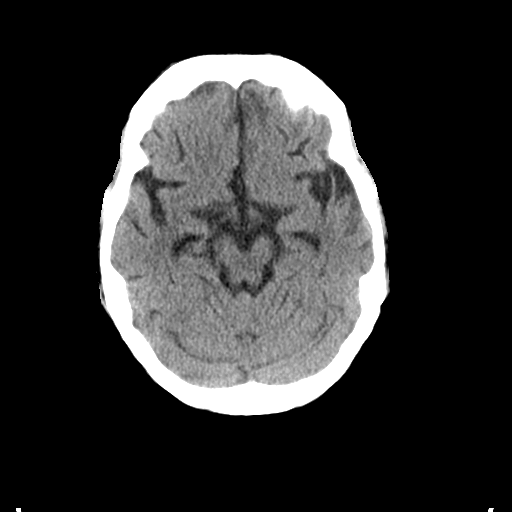
[im 14/30  brain]
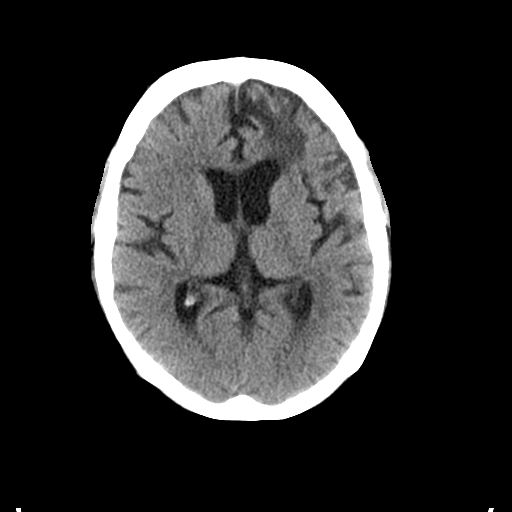
[im 17/30  brain]
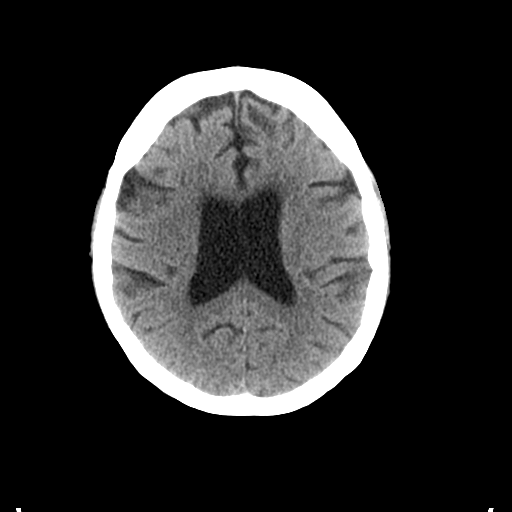
[im 17/30  bone]
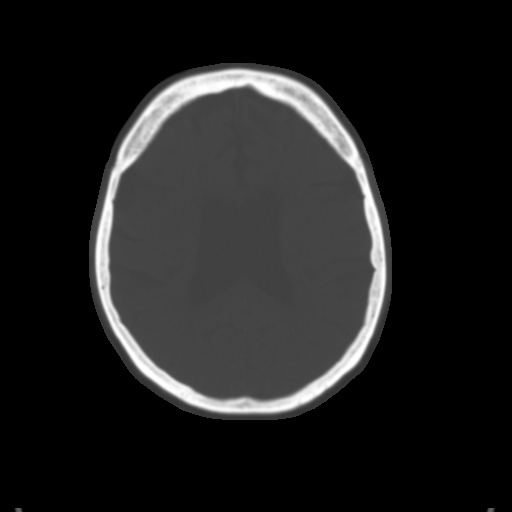
[im 21/30  brain]
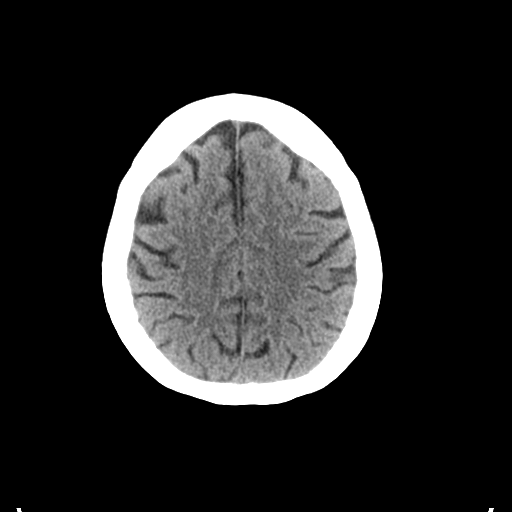
[im 24/30  brain]
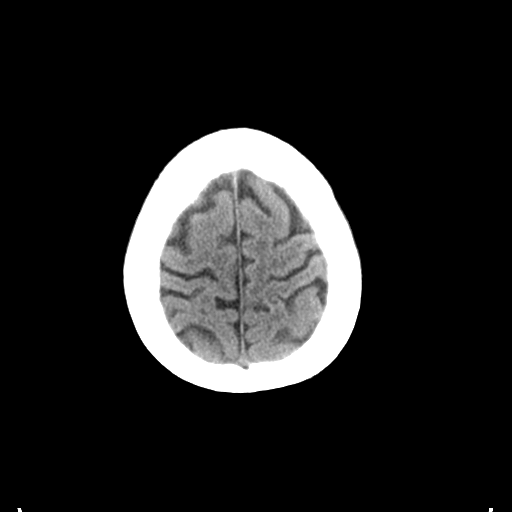
[im 28/30  brain]
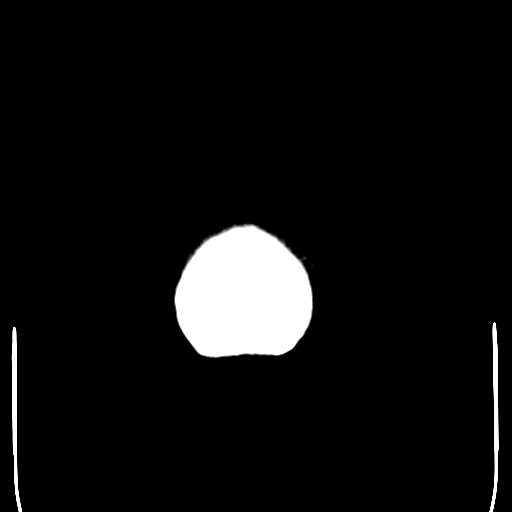

[Series 5: coronal soft · coronal · 0.30mm/px · 3 of 67 slices shown]
[im 23/67  brain]
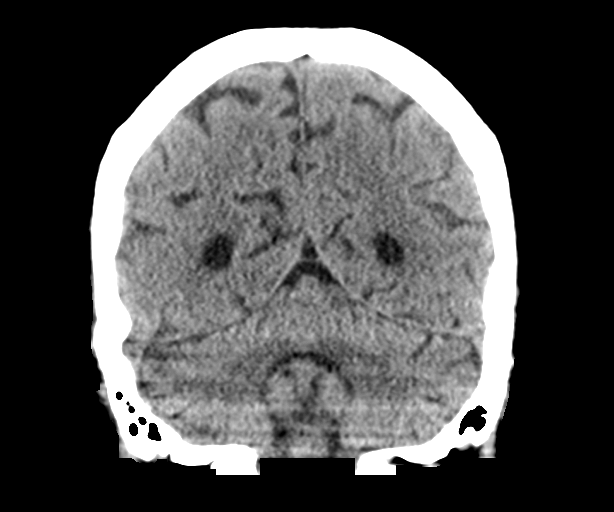
[im 30/67  brain]
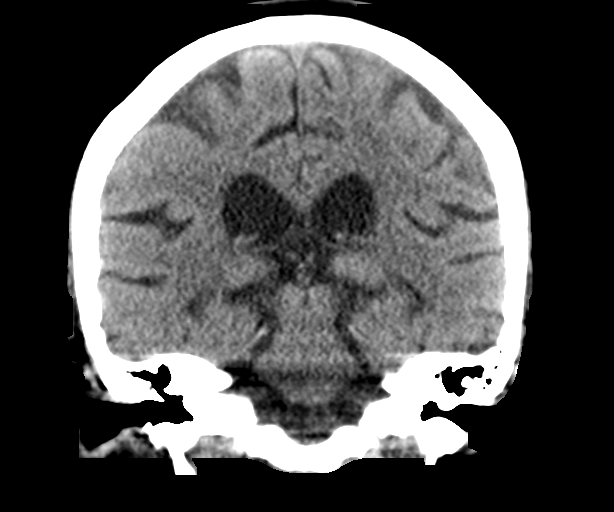
[im 37/67  brain]
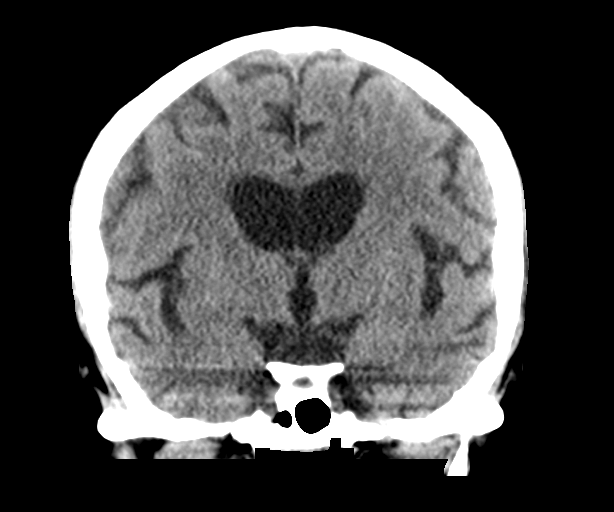

[Series 6: sagittal soft · sagittal · 0.29mm/px · 3 of 52 slices shown]
[im 18/52  brain]
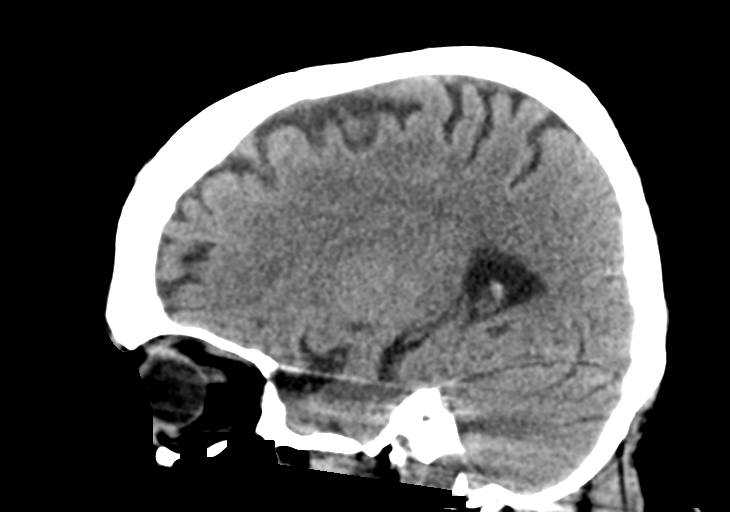
[im 26/52  brain]
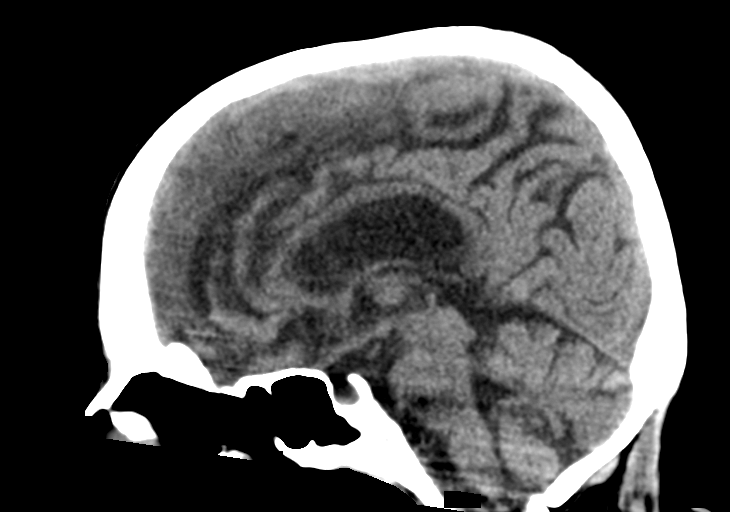
[im 35/52  brain]
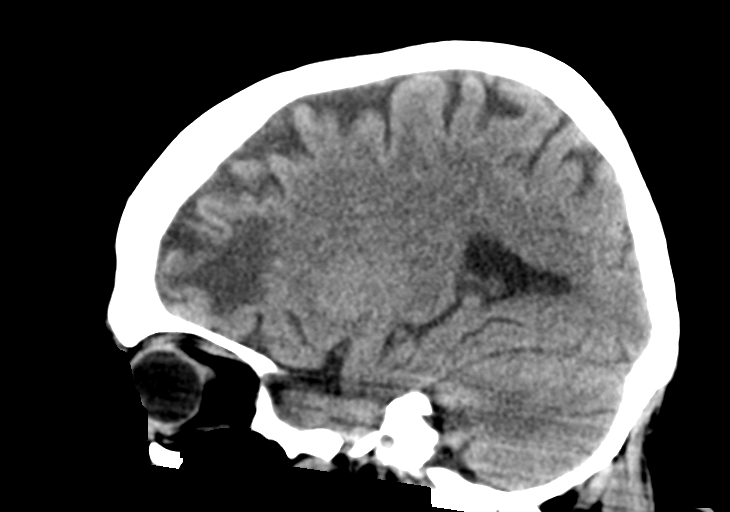

[14 of 47 positions shown; findings below may reference images not displayed]

FINDINGS: Brain: Age-related cerebral atrophy with mild chronic small vessel
ischemic disease. Scattered areas of multifocal encephalomalacia
involving the anterior left frontotemporal region most likely
related to remote trauma.

No acute intracranial hemorrhage. No acute large vessel territory
infarct. No mass lesion, mass effect, or midline shift. No
hydrocephalus or extra-axial fluid collection.

Vascular: No hyperdense vessel.

Skull: Scalp soft tissues within normal limits. Calvarium intact.
Postsurgical changes partially visualize within the upper cervical
spine.

Sinuses/Orbits: Globes and orbital soft tissues within normal
limits. Paranasal sinuses are clear. No mastoid effusion.

Other: None.

ASPECTS (Alberta Stroke Program Early CT Score)

- Ganglionic level infarction (caudate, lentiform nuclei, internal
capsule, insula, M1-M3 cortex): 7

- Supraganglionic infarction (M4-M6 cortex): 3

Total score (0-10 with 10 being normal): 10
IMPRESSION: 1. No acute intracranial infarct or other abnormality.
2. ASPECTS is 10.
3. Chronic left frontotemporal encephalomalacia, most likely related
to remote trauma.
4. Underlying age-related cerebral atrophy with mild chronic small
vessel ischemic disease.

Critical Value/emergent results were called by telephone at the time
of interpretation on [DATE] at [DATE] to provider TANA
, who verbally acknowledged these results.

## 2020-08-21 IMAGING — CT CT ABD-PELV W/O CM
2 of 4 series · 13 of 36 positions shown, 16 images · non-contrast
Comparison: X-ray abdomen [DATE], chest x-ray [DATE].

CLINICAL DATA: Abdominal trauma, code stroke. Fell 3 days ago.
Patient brought in to ED confused.

EXAM:
CT CHEST, ABDOMEN AND PELVIS WITHOUT CONTRAST
TECHNIQUE: Multidetector CT imaging of the chest, abdomen and pelvis was
performed following the standard protocol without IV contrast.

[Series 5: thins · axial · 0.79mm/px · z∈[+1064,+1535]mm · 10 of 870 slices shown, 13 images]
[im 73/870  mediastinal]
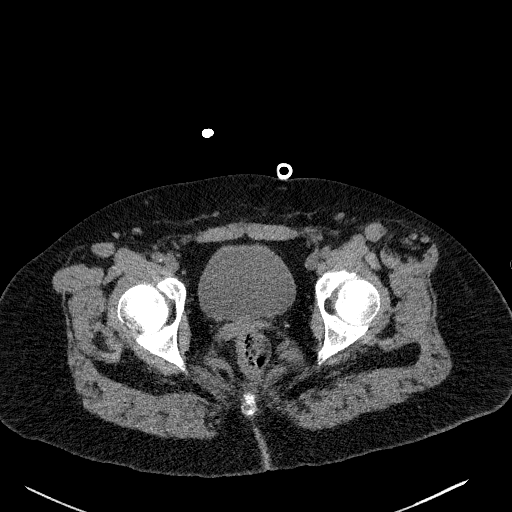
[im 73/870  lung]
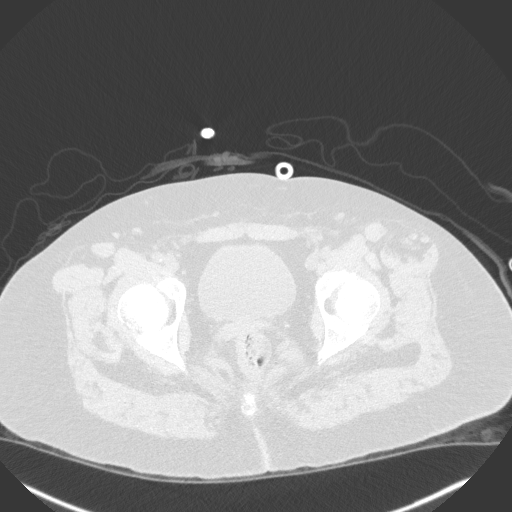
[im 145/870  lung]
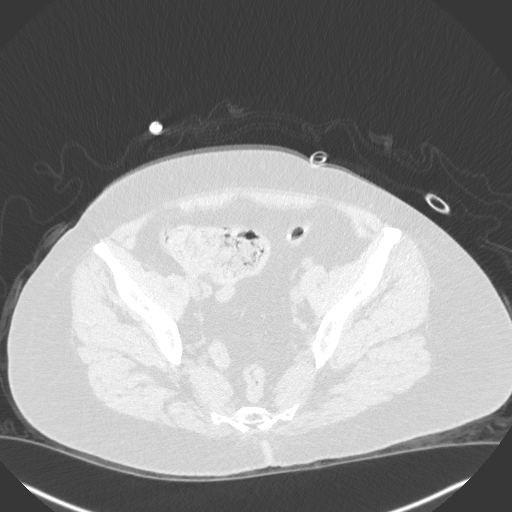
[im 218/870  lung]
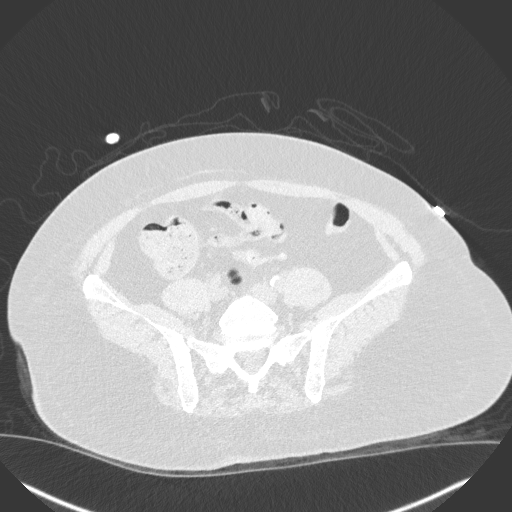
[im 326/870  lung]
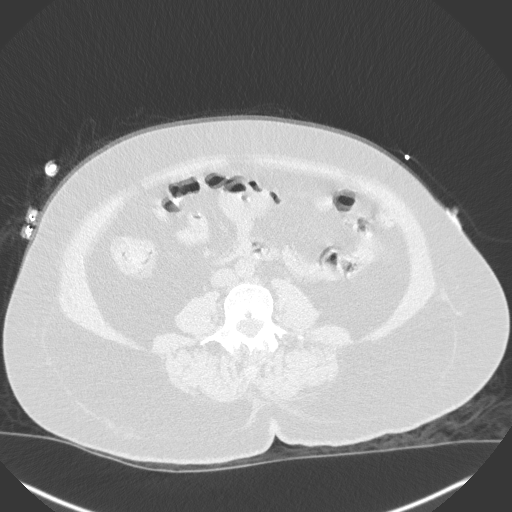
[im 399/870  mediastinal]
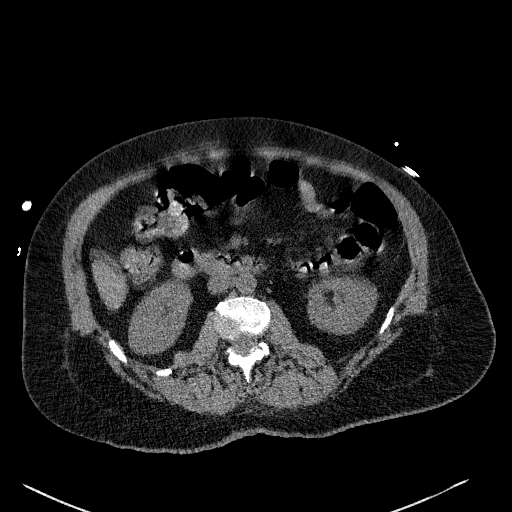
[im 399/870  lung]
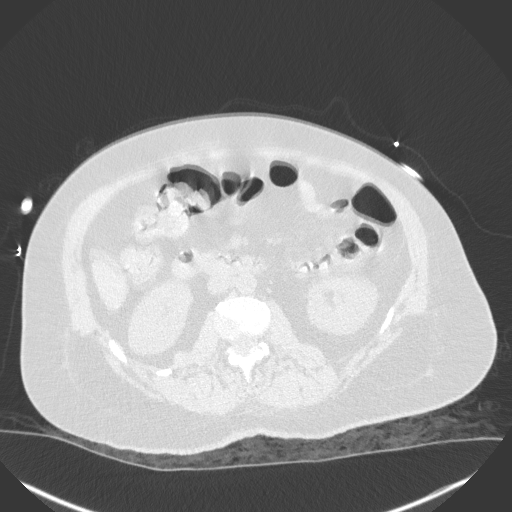
[im 471/870  lung]
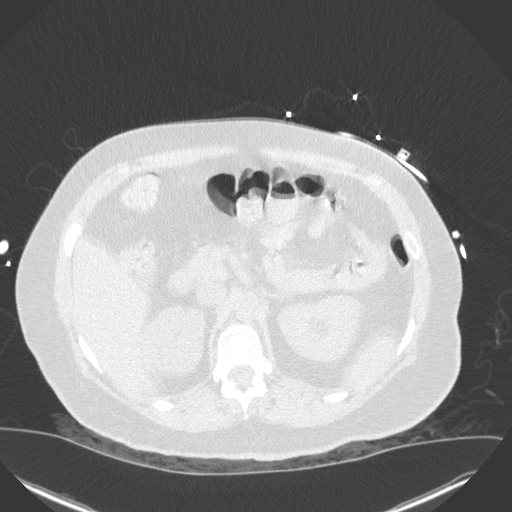
[im 544/870  lung]
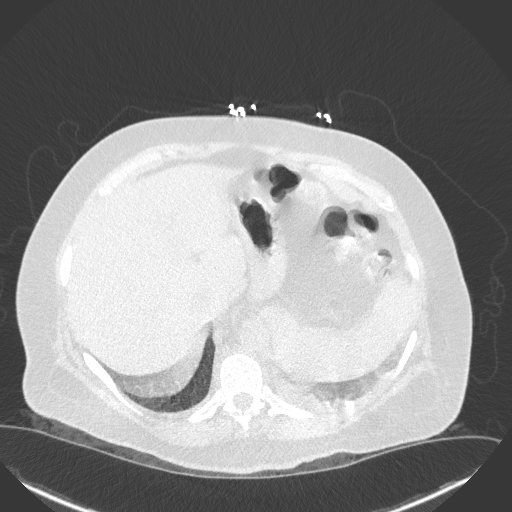
[im 652/870  lung]
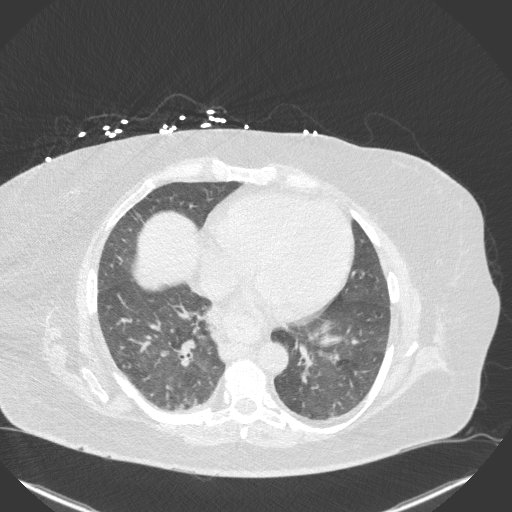
[im 725/870  mediastinal]
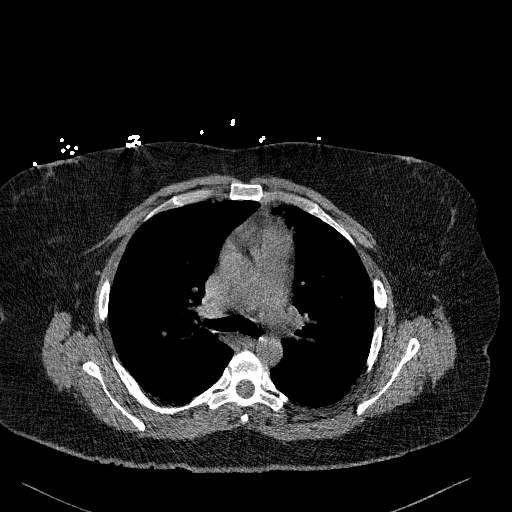
[im 725/870  lung]
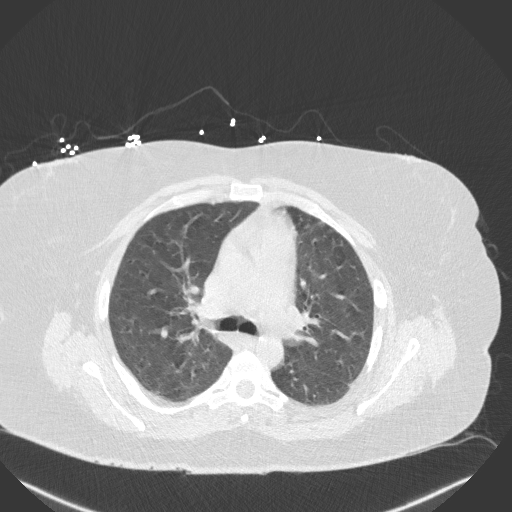
[im 797/870  lung]
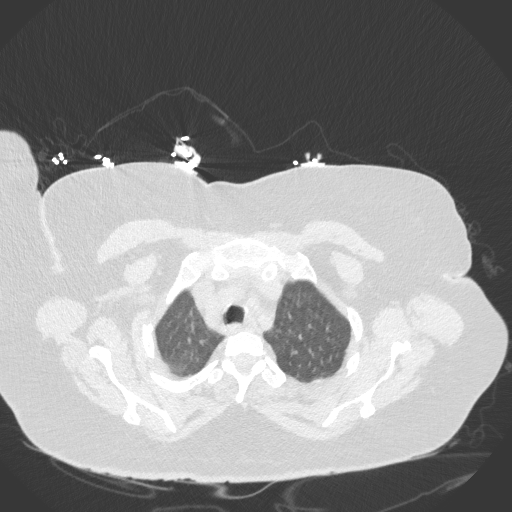

[Series 7: coronal · coronal · 0.79mm/px · 3 of 151 slices shown]
[im 31/151  lung]
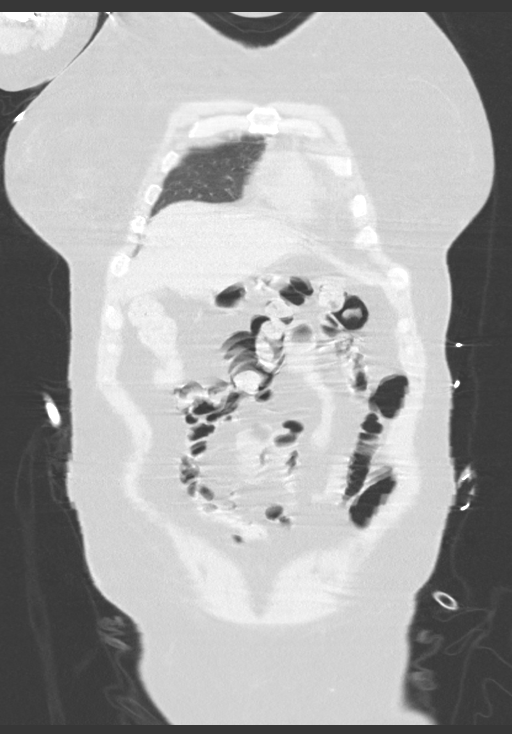
[im 61/151  lung]
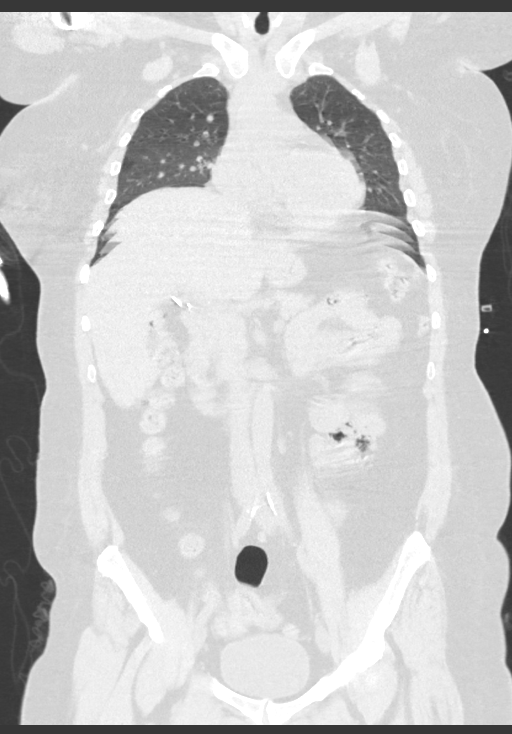
[im 91/151  lung]
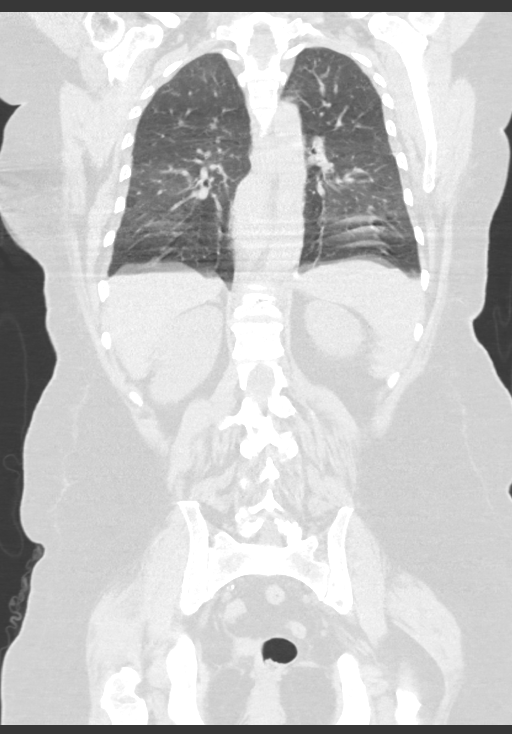

[13 of 36 positions shown; findings below may reference images not displayed]

FINDINGS: CT CHEST FINDINGS

Cardiovascular: No significant vascular findings. Normal heart size.
No pericardial effusion. Hyperdense myocardium compared to the
cardiac chambers suggestive of anemia. The thoracic aorta is normal
in caliber. Mild atherosclerotic plaque. No surrounding fat
stranding of the aorta.

Mediastinum/Nodes: No enlarged mediastinal, hilar, or axillary lymph
nodes. Thyroid gland, trachea, and esophagus demonstrate no
significant findings. Moderate volume hiatal hernia. No bowel wall
thickening or pneumatosis of the stomach.

Lungs/Pleura: Expriatory phase of respiration. Few scattered
pulmonary micro nodules. No pulmonary mass. No focal consolidation.
No pleural effusion or pneumothorax.

Musculoskeletal:

No chest wall abnormality

No suspicious lytic or blastic osseous lesions. Multiple, old
healed, nondisplaced rib fractures bilaterally. No acute displaced
rib fracture. No acute displaced sternal fracture. Multilevel
degenerative changes of the spine. Age-indeterminate compression
fracture of the T11 vertebral body with greater than 40% height
loss.

CT ABDOMEN PELVIS FINDINGS

Hepatobiliary: No focal liver abnormality is seen. Status post
cholecystectomy. No biliary dilatation.

Pancreas: Unremarkable. No pancreatic ductal dilatation or
surrounding inflammatory changes.

Spleen: Normal in size without focal abnormality.

Adrenals/Urinary Tract: No adrenal nodule bilaterally. No
nephrolithiasis, no hydronephrosis, and no contour-deforming renal
mass. No ureterolithiasis or hydroureter. The urinary bladder is
unremarkable.

Stomach/Bowel: Stomach is within normal limits. Appendix appears
normal in caliber with and a punctate appendicolith at its tip. No
evidence of bowel wall thickening, distention, or inflammatory
changes. The cecum is noted to be medialized.

Vascular/Lymphatic: No significant vascular findings are present. No
fat stranding surrounding the aorta. Mild atherosclerotic plaque. No
enlarged abdominal or pelvic lymph nodes.

Reproductive: Uterus and bilateral adnexa are unremarkable.

Other: No abdominal wall hernia or abnormality. No abdominopelvic
ascites.

Musculoskeletal:

No abdominal wall hernia or abnormality. Fatty degeneration of the
left tensor fascia lata muscle.

Sclerotic lesion with rings and arcs morphology of the right S1
within the left iliac bone ([DATE]). These lesions were previously
identified on x-ray abdomen [DATE] and therefore may represent
bone island versus enchondromas. Otherwise no suspicious or new
lytic or blastic osseous lesions. Multilevel degenerative changes of
the spine.
IMPRESSION: 1. No acute intrathoracic or intra-abdominal/intrapelvic traumatic
injury on this noncontrast study.
2. Age-indeterminate, possibly acute, T11 vertebral body fracture
with greater than 40% height loss. Correlate with tenderness to
palpation.
3. Moderate-sized hiatal hernia.
4.  Aortic Atherosclerosis ([2Z]-[2Z]).

## 2020-08-21 IMAGING — CT CT CHEST W/O CM
2 of 4 series · 13 of 36 positions shown, 16 images · non-contrast
Comparison: X-ray abdomen [DATE], chest x-ray [DATE].

CLINICAL DATA: Abdominal trauma, code stroke. Fell 3 days ago.
Patient brought in to ED confused.

EXAM:
CT CHEST, ABDOMEN AND PELVIS WITHOUT CONTRAST
TECHNIQUE: Multidetector CT imaging of the chest, abdomen and pelvis was
performed following the standard protocol without IV contrast.

[Series 4: lungs · axial · 0.79mm/px · z∈[+1046,+1552]mm · 10 of 283 slices shown, 13 images]
[im 15/283  mediastinal]
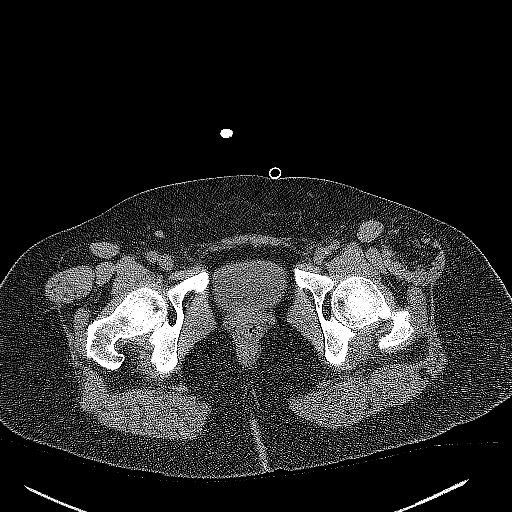
[im 15/283  lung]
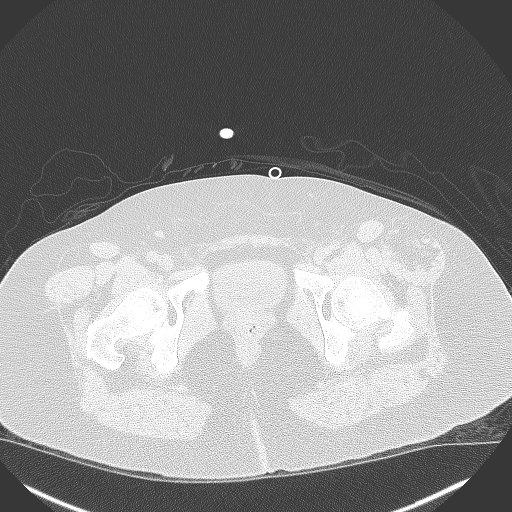
[im 45/283  lung]
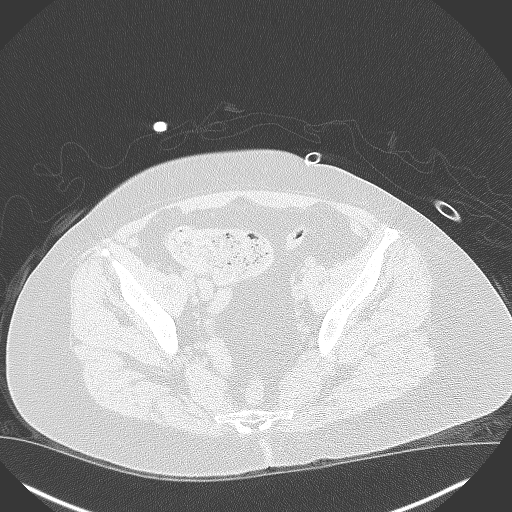
[im 75/283  lung]
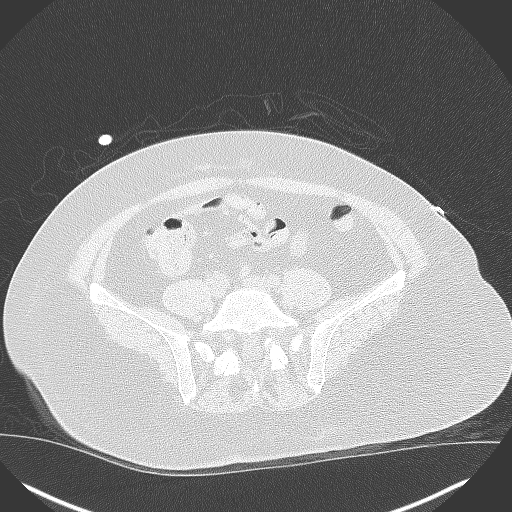
[im 104/283  lung]
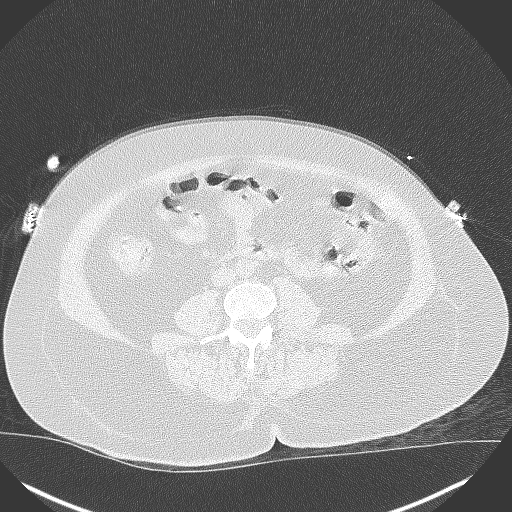
[im 134/283  mediastinal]
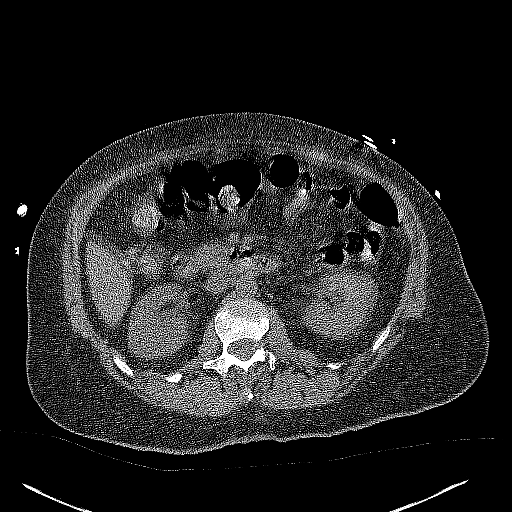
[im 134/283  lung]
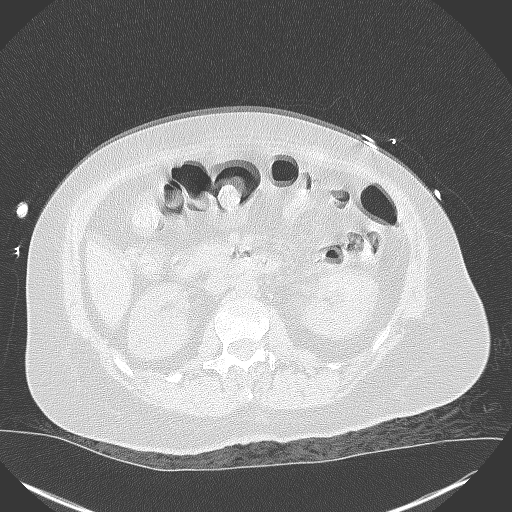
[im 149/283  lung]
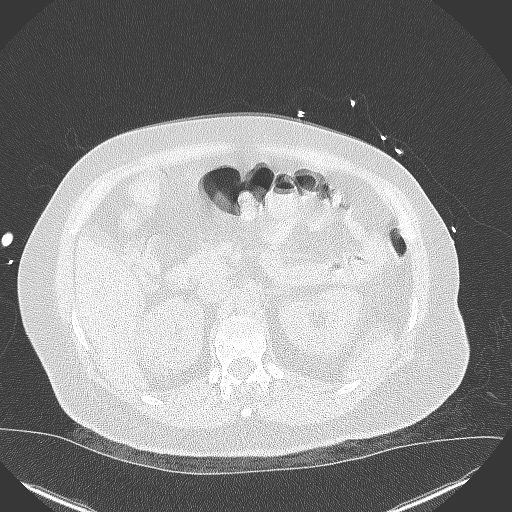
[im 179/283  lung]
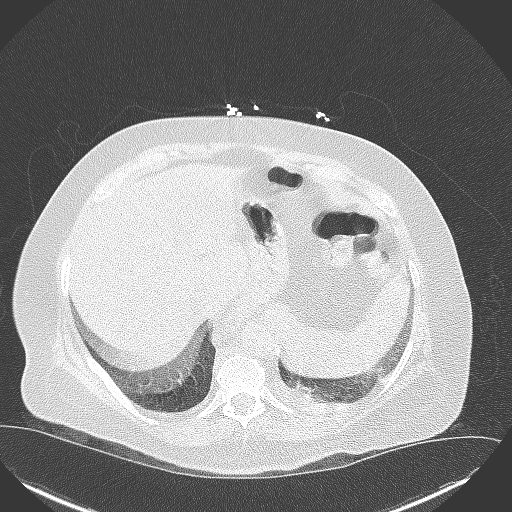
[im 208/283  lung]
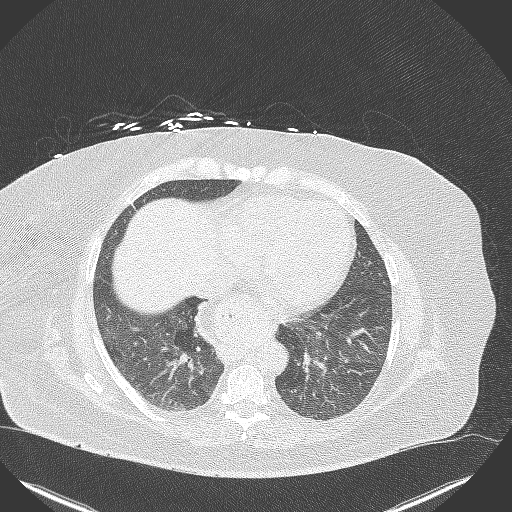
[im 238/283  mediastinal]
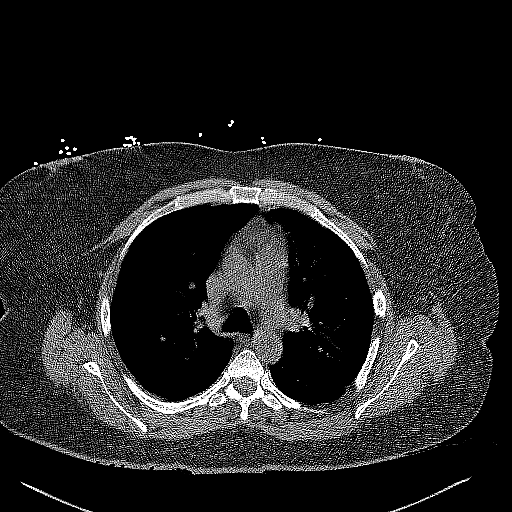
[im 238/283  lung]
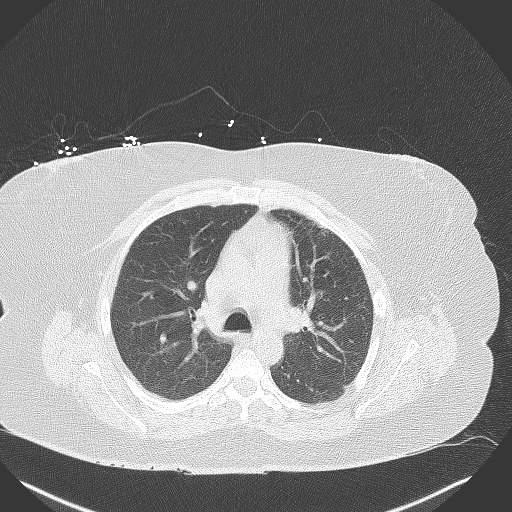
[im 268/283  lung]
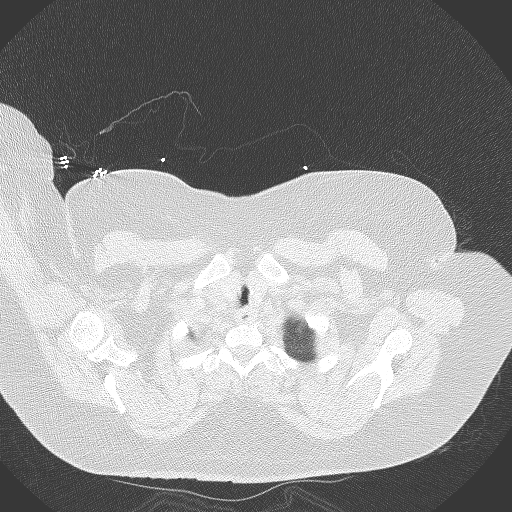

[Series 7: coronal · coronal · 0.79mm/px · 3 of 151 slices shown]
[im 31/151  lung]
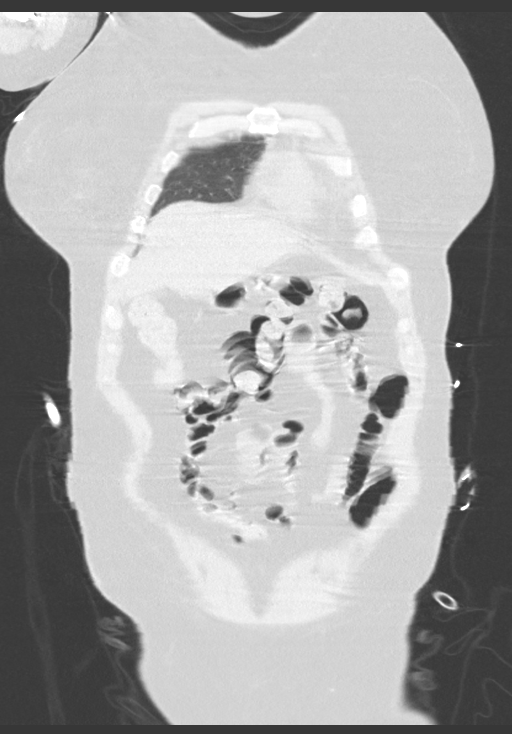
[im 61/151  lung]
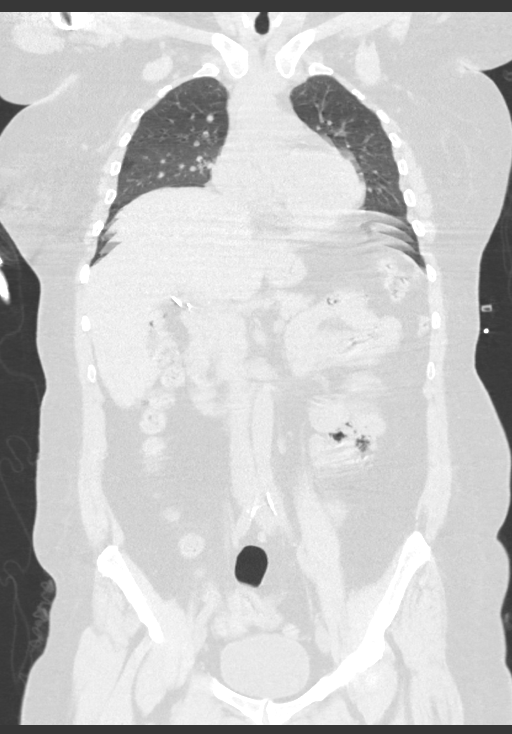
[im 91/151  lung]
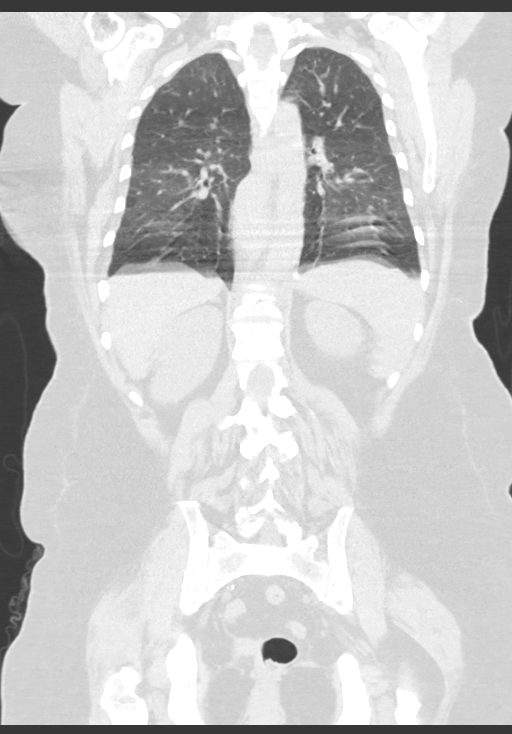

[13 of 36 positions shown; findings below may reference images not displayed]

FINDINGS: CT CHEST FINDINGS

Cardiovascular: No significant vascular findings. Normal heart size.
No pericardial effusion. Hyperdense myocardium compared to the
cardiac chambers suggestive of anemia. The thoracic aorta is normal
in caliber. Mild atherosclerotic plaque. No surrounding fat
stranding of the aorta.

Mediastinum/Nodes: No enlarged mediastinal, hilar, or axillary lymph
nodes. Thyroid gland, trachea, and esophagus demonstrate no
significant findings. Moderate volume hiatal hernia. No bowel wall
thickening or pneumatosis of the stomach.

Lungs/Pleura: Expriatory phase of respiration. Few scattered
pulmonary micro nodules. No pulmonary mass. No focal consolidation.
No pleural effusion or pneumothorax.

Musculoskeletal:

No chest wall abnormality

No suspicious lytic or blastic osseous lesions. Multiple, old
healed, nondisplaced rib fractures bilaterally. No acute displaced
rib fracture. No acute displaced sternal fracture. Multilevel
degenerative changes of the spine. Age-indeterminate compression
fracture of the T11 vertebral body with greater than 40% height
loss.

CT ABDOMEN PELVIS FINDINGS

Hepatobiliary: No focal liver abnormality is seen. Status post
cholecystectomy. No biliary dilatation.

Pancreas: Unremarkable. No pancreatic ductal dilatation or
surrounding inflammatory changes.

Spleen: Normal in size without focal abnormality.

Adrenals/Urinary Tract: No adrenal nodule bilaterally. No
nephrolithiasis, no hydronephrosis, and no contour-deforming renal
mass. No ureterolithiasis or hydroureter. The urinary bladder is
unremarkable.

Stomach/Bowel: Stomach is within normal limits. Appendix appears
normal in caliber with and a punctate appendicolith at its tip. No
evidence of bowel wall thickening, distention, or inflammatory
changes. The cecum is noted to be medialized.

Vascular/Lymphatic: No significant vascular findings are present. No
fat stranding surrounding the aorta. Mild atherosclerotic plaque. No
enlarged abdominal or pelvic lymph nodes.

Reproductive: Uterus and bilateral adnexa are unremarkable.

Other: No abdominal wall hernia or abnormality. No abdominopelvic
ascites.

Musculoskeletal:

No abdominal wall hernia or abnormality. Fatty degeneration of the
left tensor fascia lata muscle.

Sclerotic lesion with rings and arcs morphology of the right S1
within the left iliac bone ([DATE]). These lesions were previously
identified on x-ray abdomen [DATE] and therefore may represent
bone island versus enchondromas. Otherwise no suspicious or new
lytic or blastic osseous lesions. Multilevel degenerative changes of
the spine.
IMPRESSION: 1. No acute intrathoracic or intra-abdominal/intrapelvic traumatic
injury on this noncontrast study.
2. Age-indeterminate, possibly acute, T11 vertebral body fracture
with greater than 40% height loss. Correlate with tenderness to
palpation.
3. Moderate-sized hiatal hernia.
4.  Aortic Atherosclerosis ([2Z]-[2Z]).

## 2020-08-21 MED ORDER — SODIUM CHLORIDE 0.9 % IV BOLUS
1000.0000 mL | Freq: Once | INTRAVENOUS | Status: AC
  Administered 2020-08-21: 1000 mL via INTRAVENOUS

## 2020-08-21 MED ORDER — SODIUM CHLORIDE 0.9 % IV SOLN
10.0000 mL/h | Freq: Once | INTRAVENOUS | Status: AC
  Administered 2020-08-21: 10 mL/h via INTRAVENOUS

## 2020-08-21 NOTE — H&P (Signed)
History and Physical    Kylie Wells YKD:[id number] DOB: 01-Nov-1966 DOA: 08/21/2020  PCP: Pcp, No  Patient coming from: Home.  I have personally briefly reviewed patient's old medical records in [facility]  Chief Complaint: AMS.  HPI: Kylie Wells is a 54 y.o. female with medical history significant of GERD, TBI in 2018 who was brought to the emergency department via EMS due to a possible occult stroke.  Her husband stated that he last saw her normal around 1430 earlier today, she took hydrocodone and a muscle relaxant earlier and went to sleep.  When she woke up about 4 hours later, she was very confused.  EMS stated that she was altered on their initial assessment.  The patient has been taking analgesics and muscle relaxants after having a fall over the weekend and diagnosed with musculoskeletal pain on her back.  Her husband states that she has been taking a lot of ibuprofen until recently due to chronic left foot pain.  She was scheduled to have surgery on her left foot to try to correct this issue, but this was postponed due to the COVID-19 delta variant healthcare crisis.  They deny vaginal bleed, melena or hematemesis.  The husband stated that she has been sleeping more than usual.  He also noticed that her systolic BP was in the low 100s when she was at the urgent care, which is unusual for her.  No fever, chills, headache, sore throat, rhinorrhea, wheezing or hemoptysis.  No chest pain, palpitations, diaphoresis, PND, orthopnea or recent pitting edema of the lower extremities.  No abdominal pain, diarrhea, constipation, melena or hematochezia.  No dysuria, frequency or hematuria.  Denies polyuria, polydipsia, polyphagia or blurred vision.  ED Course: Initial vital signs were temperature 98.4 F, pulse 102, respiration 14, blood pressure 144/67 mmHg O2 sat 100% on room air.  The patient was going to be transferred to [facility] for MRA brain/neck at the request of teleneurology, but this was  halted after the patient's hemoglobin came back at 3.7 g/dL.  A transfusion of 3 units of PRBC was started.  The bed request for [facility] was subsequently canceled since by the time the blood transfusion is finished, [facility] transports her and MRI is obtained there, our scanner [facility] to be available.  Urinalysis showed a hazy color, ketonuria 20 mg/dL, with positive nitrates and many bacteria on microscopic examination.  There was no hematuria or pyuria.  UDS positive for opiates and THC.  Her CBC showed a White count 7.9, hemoglobin 3.7 g/dL and platelets of 397.  CMP shows a CO2 of 19 mmol/L.  Glucose of milligrams per deciliter.  All other CMP values are within normal range when calcium is corrected to albumin.  Imaging: CT head code stroke did not show any acute intracranial infarct or other abnormality.  CT chest/abdomen/pelvis did not show any acute traumatic injury.  There is age-indeterminate, possibly acute, T11 vertebral body fracture with greater then 40% height loss.  Please see images and full radiology report for further detail.  Review of Systems: As per HPI otherwise all other systems reviewed and are negative.  Past Medical History:  Diagnosis Date  . GERD (gastroesophageal reflux disease)   . TBI (traumatic brain injury) (HCC) 08/21/2020    Past Surgical History:  Procedure Laterality Date  . BREAST SURGERY      Social History  reports that she has never smoked. She has never used smokeless tobacco. She reports current alcohol use. She reports that she  does not use drugs.  Allergies  Allergen Reactions  . Shellfish Allergy   . Sulfa Antibiotics     Family History  Problem Relation Age of Onset  . Cancer Mother        lung  . Hypertension Mother   . Hyperlipidemia Sister    Prior to Admission medications   Medication Sig Start Date End Date Taking? Authorizing Provider  MAGNESIUM PO Take 1,350 mg by mouth daily.   Yes [provider]  pantoprazole (PROTONIX)  40 MG tablet Take 40 mg by mouth daily.     Yes [provider]  vitamin B-12 (CYANOCOBALAMIN) 500 MCG tablet Take 500 mcg by mouth daily.     Yes [provider]    Physical Exam: Vitals:   08/21/20 2300 08/21/20 2305 08/21/20 2313 08/21/20 2328  BP: 139/77 130/65  137/60  Pulse: 91 (!) 102  93  Resp: (!) 26 14  (!) 23  Temp:  98.4 F (36.9 C)  98.3 F (36.8 C)  TempSrc:  Oral Oral Oral  SpO2: 100% 100%  100%  Weight:      Height:        Constitutional: NAD, calm, comfortable Eyes: PERRL, lids and conjunctivae normal ENMT: Mucous membranes are moist. Posterior pharynx clear of any exudate or lesions. Neck: normal, supple, no masses, no thyromegaly Respiratory: clear to auscultation bilaterally, no wheezing, no crackles. Normal respiratory effort. No accessory muscle use.  Cardiovascular: Regular rate and rhythm, no murmurs / rubs / gallops. No extremity edema. 2+ pedal pulses. No carotid bruits.  Abdomen: Obese, nondistended.  BS positive.  No tenderness, no masses palpated. No hepatosplenomegaly. Musculoskeletal: Mid back tenderness.  No clubbing / cyanosis.  Good ROM, no contractures. Normal muscle tone.  Skin: no rashes, lesions, ulcers on limited dermatological examination. Neurologic: CN 2-12 grossly intact.  Mildly slurred speech with frequent difficulty finding words.  Decreased strength on LLE, particularly in left foot.  Psychiatric: Alert, oriented x2.  Partially disoriented to situation and disoriented to time/date.  Labs on Admission: I have personally reviewed following labs and imaging studies  CBC: Recent Labs  Lab 08/21/20 2135 08/21/20 2232  WBC 7.9 8.0  NEUTROABS 7.0 6.9  HGB 3.7* 3.7*  HCT 15.3* 16.1*  MCV 72.2* 74.2*  PLT 363 PLATELET CLUMPS NOTED ON SMEAR, UNABLE TO ESTIMATE    Basic Metabolic Panel: Recent Labs  Lab 08/21/20 2135  NA 138  K 3.5  CL 108  CO2 19*  GLUCOSE 139*  BUN 17  CREATININE 0.59  CALCIUM 8.8*     GFR: Estimated Creatinine Clearance: 78 mL/min (by C-G formula based on SCr of 0.59 mg/dL).  Liver Function Tests: Recent Labs  Lab 08/21/20 2135  AST 19  ALT 26  ALKPHOS 60  BILITOT 0.7  PROT 7.4  ALBUMIN 3.6   Urinalysis, Routine w reflex microscopic Urine, Clean Catch [[id number]] (Abnormal)   Collected: 08/21/20 2311   Updated: 08/22/20 0009   Specimen Source: Urine, Clean Catch    Color, Urine YELLOW   APPearance HAZYAbnormal   Specific Gravity, Urine 1.015   pH 8.0   Glucose, UA NEGATIVE mg/dL   Hgb urine dipstick NEGATIVE   Bilirubin Urine NEGATIVE   Ketones, ur 20Abnormal mg/dL   Protein, ur NEGATIVE mg/dL   Nitrite POSITIVEAbnormal   Leukocytes,Ua NEGATIVE   RBC / HPF 0-5 RBC/hpf   WBC, UA 0-5 WBC/hpf   Bacteria, UA MANYAbnormal   Squamous Epithelial / LPF 0-5   Mucus PRESENT  Radiological Exams on Admission: CT ABDOMEN PELVIS WO CONTRAST  Result Date: 08/21/2020 CLINICAL DATA:  Abdominal trauma, code stroke. Fell 3 days ago. Patient brought in to ED confused. EXAM: CT CHEST, ABDOMEN AND PELVIS WITHOUT CONTRAST TECHNIQUE: Multidetector CT imaging of the chest, abdomen and pelvis was performed following the standard protocol without IV contrast. COMPARISON:  X-ray abdomen 08/10/2017, chest x-ray 08/12/2017. FINDINGS: CT CHEST FINDINGS Cardiovascular: No significant vascular findings. Normal heart size. No pericardial effusion. Hyperdense myocardium compared to the cardiac chambers suggestive of anemia. The thoracic aorta is normal in caliber. Mild atherosclerotic plaque. No surrounding fat stranding of the aorta. Mediastinum/Nodes: No enlarged mediastinal, hilar, or axillary lymph nodes. Thyroid gland, trachea, and esophagus demonstrate no significant findings. Moderate volume hiatal hernia. No bowel wall thickening or pneumatosis of the stomach. Lungs/Pleura: Expriatory phase of respiration. Few scattered pulmonary micro nodules. No pulmonary mass. No  focal consolidation. No pleural effusion or pneumothorax. Musculoskeletal: No chest wall abnormality No suspicious lytic or blastic osseous lesions. Multiple, old healed, nondisplaced rib fractures bilaterally. No acute displaced rib fracture. No acute displaced sternal fracture. Multilevel degenerative changes of the spine. Age-indeterminate compression fracture of the T11 vertebral body with greater than 40% height loss. CT ABDOMEN PELVIS FINDINGS Hepatobiliary: No focal liver abnormality is seen. Status post cholecystectomy. No biliary dilatation. Pancreas: Unremarkable. No pancreatic ductal dilatation or surrounding inflammatory changes. Spleen: Normal in size without focal abnormality. Adrenals/Urinary Tract: No adrenal nodule bilaterally. No nephrolithiasis, no hydronephrosis, and no contour-deforming renal mass. No ureterolithiasis or hydroureter. The urinary bladder is unremarkable. Stomach/Bowel: Stomach is within normal limits. Appendix appears normal in caliber with and a punctate appendicolith at its tip. No evidence of bowel wall thickening, distention, or inflammatory changes. The cecum is noted to be medialized. Vascular/Lymphatic: No significant vascular findings are present. No fat stranding surrounding the aorta. Mild atherosclerotic plaque. No enlarged abdominal or pelvic lymph nodes. Reproductive: Uterus and bilateral adnexa are unremarkable. Other: No abdominal wall hernia or abnormality. No abdominopelvic ascites. Musculoskeletal: No abdominal wall hernia or abnormality. Fatty degeneration of the left tensor fascia lata muscle. Sclerotic lesion with rings and arcs morphology of the right S1 level (7:105, 3:89) as well as a nodular lesion that a similar within the left iliac bone (7:101). These lesions were previously identified on x-ray abdomen 08/10/2017 and therefore may represent bone island versus enchondromas. Otherwise no suspicious or new lytic or blastic osseous lesions. Multilevel  degenerative changes of the spine. IMPRESSION: 1. No acute intrathoracic or intra-abdominal/intrapelvic traumatic injury on this noncontrast study. 2. Age-indeterminate, possibly acute, T11 vertebral body fracture with greater than 40% height loss. Correlate with tenderness to palpation. 3. Moderate-sized hiatal hernia. 4.  Aortic Atherosclerosis (ICD10-I70.0). Electronically Signed   By: Tish FredericksonMorgane  Naveau M.D.   On: 08/21/2020 22:04   CT Chest Wo Contrast  Result Date: 08/21/2020 CLINICAL DATA:  Abdominal trauma, code stroke. Fell 3 days ago. Patient brought in to ED confused. EXAM: CT CHEST, ABDOMEN AND PELVIS WITHOUT CONTRAST TECHNIQUE: Multidetector CT imaging of the chest, abdomen and pelvis was performed following the standard protocol without IV contrast. COMPARISON:  X-ray abdomen 08/10/2017, chest x-ray 08/12/2017. FINDINGS: CT CHEST FINDINGS Cardiovascular: No significant vascular findings. Normal heart size. No pericardial effusion. Hyperdense myocardium compared to the cardiac chambers suggestive of anemia. The thoracic aorta is normal in caliber. Mild atherosclerotic plaque. No surrounding fat stranding of the aorta. Mediastinum/Nodes: No enlarged mediastinal, hilar, or axillary lymph nodes. Thyroid gland, trachea, and esophagus demonstrate no significant findings. Moderate  volume hiatal hernia. No bowel wall thickening or pneumatosis of the stomach. Lungs/Pleura: Expriatory phase of respiration. Few scattered pulmonary micro nodules. No pulmonary mass. No focal consolidation. No pleural effusion or pneumothorax. Musculoskeletal: No chest wall abnormality No suspicious lytic or blastic osseous lesions. Multiple, old healed, nondisplaced rib fractures bilaterally. No acute displaced rib fracture. No acute displaced sternal fracture. Multilevel degenerative changes of the spine. Age-indeterminate compression fracture of the T11 vertebral body with greater than 40% height loss. CT ABDOMEN PELVIS FINDINGS  Hepatobiliary: No focal liver abnormality is seen. Status post cholecystectomy. No biliary dilatation. Pancreas: Unremarkable. No pancreatic ductal dilatation or surrounding inflammatory changes. Spleen: Normal in size without focal abnormality. Adrenals/Urinary Tract: No adrenal nodule bilaterally. No nephrolithiasis, no hydronephrosis, and no contour-deforming renal mass. No ureterolithiasis or hydroureter. The urinary bladder is unremarkable. Stomach/Bowel: Stomach is within normal limits. Appendix appears normal in caliber with and a punctate appendicolith at its tip. No evidence of bowel wall thickening, distention, or inflammatory changes. The cecum is noted to be medialized. Vascular/Lymphatic: No significant vascular findings are present. No fat stranding surrounding the aorta. Mild atherosclerotic plaque. No enlarged abdominal or pelvic lymph nodes. Reproductive: Uterus and bilateral adnexa are unremarkable. Other: No abdominal wall hernia or abnormality. No abdominopelvic ascites. Musculoskeletal: No abdominal wall hernia or abnormality. Fatty degeneration of the left tensor fascia lata muscle. Sclerotic lesion with rings and arcs morphology of the right S1 level (7:105, 3:89) as well as a nodular lesion that a similar within the left iliac bone (7:101). These lesions were previously identified on x-ray abdomen 08/10/2017 and therefore may represent bone island versus enchondromas. Otherwise no suspicious or new lytic or blastic osseous lesions. Multilevel degenerative changes of the spine. IMPRESSION: 1. No acute intrathoracic or intra-abdominal/intrapelvic traumatic injury on this noncontrast study. 2. Age-indeterminate, possibly acute, T11 vertebral body fracture with greater than 40% height loss. Correlate with tenderness to palpation. 3. Moderate-sized hiatal hernia. 4.  Aortic Atherosclerosis (ICD10-I70.0). Electronically Signed   By: Tish Frederickson M.D.   On: 08/21/2020 22:04   CT HEAD CODE  STROKE WO CONTRAST  Result Date: 08/21/2020 CLINICAL DATA:  Code stroke. Initial evaluation for acute altered mental status, confusion. EXAM: CT HEAD WITHOUT CONTRAST TECHNIQUE: Contiguous axial images were obtained from the base of the skull through the vertex without intravenous contrast. COMPARISON:  None available. FINDINGS: Brain: Age-related cerebral atrophy with mild chronic small vessel ischemic disease. Scattered areas of multifocal encephalomalacia involving the anterior left frontotemporal region most likely related to remote trauma. No acute intracranial hemorrhage. No acute large vessel territory infarct. No mass lesion, mass effect, or midline shift. No hydrocephalus or extra-axial fluid collection. Vascular: No hyperdense vessel. Skull: Scalp soft tissues within normal limits. Calvarium intact. Postsurgical changes partially visualize within the upper cervical spine. Sinuses/Orbits: Globes and orbital soft tissues within normal limits. Paranasal sinuses are clear. No mastoid effusion. Other: None. ASPECTS [facility] Stroke Program Early CT Score) - Ganglionic level infarction (caudate, lentiform nuclei, internal capsule, insula, M1-M3 cortex): 7 - Supraganglionic infarction (M4-M6 cortex): 3 Total score (0-10 with 10 being normal): 10 IMPRESSION: 1. No acute intracranial infarct or other abnormality. 2. ASPECTS is 10. 3. Chronic left frontotemporal encephalomalacia, most likely related to remote trauma. 4. Underlying age-related cerebral atrophy with mild chronic small vessel ischemic disease. Critical Value/emergent results were called by telephone at the time of interpretation on 08/21/2020 at 9:37 pm to provider JOSEPH ZAMMIT , who verbally acknowledged these results. Electronically Signed   By: Sharlet Salina  Phill Myron M.D.   On: 08/21/2020 21:39    EKG: Independently reviewed.   Assessment/Plan Principal Problem:   Aphasia Observation/telemetry. Unable to transfer to [facility] for MRA due to low  H&H. Will perform MRI at [facility] in a.m. after blood transfusion. I informed the husband about this change of plan. Frequent neuro checks. Swallow screen. Check fasting lipids and hemoglobin A1c. Echocardiogram. Consult neurology.  Active Problems:   Acute blood loss anemia (symptomatic) Continue blood transfusion. Monitor H&H. NPO. Protonix 40 mg IVP every 12 hr. Consult GI. Avoid NSAIDs use in the future.    Compression fracture of T11 vertebra (HCC) Low-dose non-NSAID analgesics as needed.    GERD (gastroesophageal reflux disease) Continue PPI.    Asymptomatic bacteriuria No fever, flank pain or GU symptoms. No pyuria on microscopic examination. No leukocytosis on CBC. Antibiotic therapy deferred.    Class I obesity  Lifestyle modifications will be needed.     DVT prophylaxis: SCDs. Code Status:   Full code. Family Communication:  Her husband, Smitty Cords, was in the ED and provided history. Disposition Plan:   Patient is from:  Home.  Anticipated DC to:  Home.  Anticipated DC date:  08/22/2020 or 08/23/2020.  Anticipated DC barriers: Clinical status.  MRI results.  Consults called:  Teleneurology was consulted.  Please see consult note. Admission status:  Outpatient/telemetry.  Severity of Illness:   Bobette Mo MD [facility]  How to contact the [facility] Attending or Consulting provider 7A - 7P or covering provider during after hours 7P -7A, for this patient?   1. Check the care team in [facility] and look for a) attending/consulting [facility] provider listed and b) the [facility] team listed 2. Log into [email or web address] and use [place]'s universal password to access. If you do not have the password, please contact the hospital operator. 3. Locate the [facility] provider you are looking for under [facility] and page to a number that you can be directly reached. 4. If you still have difficulty reaching the provider, please page the [facility] (Director on Call) for the Hospitalists  listed on amion for assistance.  08/21/2020, 11:57 PM   This document was prepared using Dragon voice recognition software and may contain some unintended transcription errors.

## 2020-08-21 NOTE — ED Notes (Signed)
SPOK paged @ [id number]

## 2020-08-21 NOTE — Consult Note (Addendum)
[facility] TeleNeurology Consult Services   Date of Service:   08/21/2020 21:19:03  Impression:     .  R47.01 - Aphasia     .  R41.82 - AMS (Altered Mental Status)  Comments/Sign-Out: Patient is a 54 yo female with a PMH of severe TBI in 06/2017 with associated revealed scattered areas of traumatic subarachnoid hemorrhage, a small right cerebellar tentorial subdural hematoma, and a comminuted right temporal bone fracture. CT scan of the neck revealed left C1 lateral mass fracture. Additional injuries included right EAC laceration, multiple bilateral rib fractures, displaced closed right humerus fracture, right ICA dissection, and left vertebral artery injury NOS with residual impaired short-term memory, poor balance, and continued numbness of the left arm and leg, chronic peripheral neuropathy who p/w AMS with associated expressive aphasia. LNK 14:30, per EMS report and patient. On exam she has mixed expressive and receptive aphasia, with subtle right facial droop. CTH reviewed, NAF. Scattered areas of multifocal encephalomalcia in the anterior left frontotemporal area. Given LNK >4.5 hours and h/o ICH, severe TBI Alteplase is not indicated. NIHSS of 6, mRS of 2-3 per review of chart. Given aphasia on exam, further emergent testing is warranted to exclude LVO. Patient has a documented severe allergy, anaphylaxis, to contrast. I am unable to reach her husband to discuss risks/benefits of emergent CTA. Rec STAT MRI brain w/o, MRA head w/o and MRA neck w/wo to evaluate for possible LVO and acute stroke. Differential includes seizures, encephalopathy. Patient will require transfer for STAT MRI not available at this facility. Please confirm no contraindications/stimulators prior to emergent transfer.  AddednumJeanene Erb by ED MD following consult. Labs revealed profound anemia hemoglobin 3.7. CT abdomen/pelvis done. Unclear etiology. Patient will require PRBC transfusion and stabilization  prior to transfer.   Metrics: Last Known Well: 08/21/2020 14:30:00 [facility] Notification Time: 08/21/2020 21:19:03 Arrival Time: 08/21/2020 20:55:00 Stamp Time: 08/21/2020 21:19:03 Time First Login Attempt: 08/21/2020 21:22:36 Symptoms: AMS. NIHSS Start Assessment Time: 08/21/2020 21:22:36 Patient is not a candidate for Thrombolytic. Thrombolytic Medical Decision: 08/21/2020 21:25:00 Patient was not deemed candidate for Thrombolytic because of following reasons: Last Well Known Above 4.5 Hours. Current or Previous ICH.  CT head showed no acute hemorrhage or acute core infarct. CT head was reviewed.  ED Physician notified of diagnostic impression and management plan on 08/21/2020 22:00:00  Advanced Imaging: Advanced Imaging Not Recommended because:  Rec transfer for STAT MRI, severe contrast allergy unable to reach husband for consent for CT contrast   Our recommendations are outlined below.  Recommendations:     .  Activate Stroke Protocol Admission/Order Set     .  Stroke/Telemetry Floor     .  Neuro Checks     .  Bedside Swallow Eval     .  DVT Prophylaxis     .  IV Fluids, Normal Saline     .  Head of Bed 30 Degrees     .  Euglycemia and Avoid Hyperthermia (PRN Acetaminophen)     .  Initiate Aspirin 81 MG Daily     .  ASA 325mg  in ED  Routine Consultation with Inhouse Neurology for Follow up Care  Sign Out:     .  Discussed with Emergency Department Provider    ------------------------------------------------------------------------------  History of Present Illness: Patient is a 54 year old Female.  Patient was brought by EMS for symptoms of AMS.  Patient is a 54 yo female with a PMH of severe TBI in 06/2017 with associated revealed  scattered areas of traumatic subarachnoid hemorrhage, a small right cerebellar tentorial subdural hematoma, and a comminuted right temporal bone fracture. CT scan of the neck revealed left C1 lateral mass fracture.  Additional injuries included right EAC laceration, multiple bilateral rib fractures, displaced closed right humerus fracture, right ICA dissection, and left vertebral artery injury NOS with residual impaired short-term memory, poor balance, and continued numbness of the left arm and leg, chronic peripheral neuropathy who p/w AMS with associated expressive aphasia. LNK 14:30, per EMS report and patient. Patient had a fall 3 days prior, unclear circumstances with residual back pain. She took pain medication this afternoon and went to sleep at 14:30. At this time her mentation was at baseline. When she awoke at 18:30, she was confused with difficulty speaking. This has persisted in the ED.  Last seen normal was beyond 4.5 hours of presentation. There is history of hemorrhagic complications or intracranial hemorrhage. There is no history of Recent Anticoagulants.  Anticoagulant use:  No     Examination: BP(144/67), Pulse(Reviewed), Blood Glucose(174) 1A: Level of Consciousness - Alert; keenly responsive + 0 1B: Ask Month and Age - 1 Question Right + 1 1C: Blink Eyes & Squeeze Hands - Performs 1 Task + 1 2: Test Horizontal Extraocular Movements - Normal + 0 3: Test Visual Fields - No Visual Loss + 0 4: Test Facial Palsy (Use Grimace if Obtunded) - Minor paralysis (flat nasolabial fold, smile asymmetry) + 1 5A: Test Left Arm Motor Drift - No Drift for 10 Seconds + 0 5B: Test Right Arm Motor Drift - No Drift for 10 Seconds + 0 6A: Test Left Leg Motor Drift - No Drift for 5 Seconds + 0 6B: Test Right Leg Motor Drift - No Drift for 5 Seconds + 0 7: Test Limb Ataxia (FNF/Heel-Shin) - No Ataxia + 0 8: Test Sensation - Normal; No sensory loss + 0 9: Test Language/Aphasia - Severe Aphasia: Fragmentary Expression, Inference Needed, Cannot Identify Materials + 2 10: Test Dysarthria - Mild-Moderate Dysarthria: Slurring but can be understood + 1 11: Test Extinction/Inattention - No abnormality + 0  NIHSS  Score: 6  Pre-Morbid Modified Rankin Scale: Unable to assess   Patient/Family was informed the Neurology Consult would occur via TeleHealth consult by way of interactive audio and video telecommunications and consented to receiving care in this manner.   Patient is being evaluated for possible acute neurologic impairment and high probability of imminent or life-threatening deterioration. I spent total of 40 minutes providing care to this patient, including time for face to face visit via telemedicine, review of medical records, imaging studies and discussion of findings with providers, the patient and/or family.   Dr Jennefer Bravo   [facility] [phone]  Case [id number]

## 2020-08-21 NOTE — ED Provider Notes (Signed)
[facility] EMERGENCY DEPARTMENT Provider Note   CSN: [id number] Arrival date & time: 08/21/20  2055     History Chief Complaint  Patient presents with  . Altered Mental Status    Kylie Wells is a 54 y.o. female.  Patient last seen normal at 2:30 PM.  Patient awoke confused with difficulty getting words out.  Patient did have a history of a fall a few days ago and is complaining of back pain  The history is provided by the patient and a relative. No language interpreter was used.  Altered Mental Status Presenting symptoms: behavior changes   Severity:  Moderate Most recent episode:  Today Episode history:  Multiple Timing:  Constant Progression:  Waxing and waning Chronicity:  New Context: not alcohol use        Past Medical History:  Diagnosis Date  . GERD (gastroesophageal reflux disease)     Patient Active Problem List   Diagnosis Date Noted  . Aphasia 08/21/2020    Past Surgical History:  Procedure Laterality Date  . BREAST SURGERY       OB History   No obstetric history on file.     Family History  Problem Relation Age of Onset  . Cancer Mother        lung  . Hypertension Mother   . Hyperlipidemia Sister     Social History   Tobacco Use  . Smoking status: Never Smoker  . Smokeless tobacco: Never Used  Substance Use Topics  . Alcohol use: Yes  . Drug use: No    Home Medications Prior to Admission medications   Medication Sig Start Date End Date Taking? Authorizing Provider  MAGNESIUM PO Take 1,350 mg by mouth daily.   Yes [provider]  pantoprazole (PROTONIX) 40 MG tablet Take 40 mg by mouth daily.     Yes [provider]  vitamin B-12 (CYANOCOBALAMIN) 500 MCG tablet Take 500 mcg by mouth daily.     Yes [provider]    Allergies    Shellfish allergy and Sulfa antibiotics  Review of Systems   Review of Systems  Unable to perform ROS: Mental status change    Physical Exam Updated Vital Signs BP  137/60   Pulse 93   Temp 98.3 F (36.8 C) (Oral)   Resp (!) 23   Ht 5\' 1"  (1.549 m)   Wt 78.5 kg   LMP  (LMP Unknown)   SpO2 100%   BMI 32.70 kg/m   Physical Exam Vitals and nursing note reviewed.  Constitutional:      Appearance: She is well-developed.  HENT:     Head: Normocephalic.     Nose: Nose normal.  Eyes:     General: No scleral icterus.    Conjunctiva/sclera: Conjunctivae normal.  Neck:     Thyroid: No thyromegaly.  Cardiovascular:     Rate and Rhythm: Normal rate and regular rhythm.     Heart sounds: No murmur heard.  No friction rub. No gallop.   Pulmonary:     Breath sounds: No stridor. No wheezing or rales.  Chest:     Chest wall: No tenderness.  Abdominal:     General: There is no distension.     Tenderness: There is no abdominal tenderness. There is no rebound.  Genitourinary:    Comments: Brown stool hem neg Musculoskeletal:        General: Normal range of motion.     Cervical back: Neck supple.  Comments: Tenderness lumbar spine  Lymphadenopathy:     Cervical: No cervical adenopathy.  Skin:    Findings: No erythema or rash.  Neurological:     Mental Status: She is alert and oriented to person, place, and time.     Motor: No abnormal muscle tone.     Coordination: Coordination normal.     Comments: Patient confused and having receptive and expressive aphasia  Psychiatric:        Behavior: Behavior normal.     ED Results / Procedures / Treatments   Labs (all labs ordered are listed, but only abnormal results are displayed) Labs Reviewed  CBC - Abnormal; Notable for the following components:      Result Value   RBC 2.12 (*)    Hemoglobin 3.7 (*)    HCT 15.3 (*)    MCV 72.2 (*)    MCH 17.5 (*)    MCHC 24.2 (*)    RDW 21.4 (*)    nRBC 0.6 (*)    All other components within normal limits  COMPREHENSIVE METABOLIC PANEL - Abnormal; Notable for the following components:   CO2 19 (*)    Glucose, Bld 139 (*)    Calcium 8.8 (*)    All  other components within normal limits  CBC WITH DIFFERENTIAL/PLATELET - Abnormal; Notable for the following components:   RBC 2.17 (*)    Hemoglobin 3.7 (*)    HCT 16.1 (*)    MCV 74.2 (*)    MCH 17.1 (*)    MCHC 23.0 (*)    RDW 21.1 (*)    nRBC 0.5 (*)    All other components within normal limits  CBG MONITORING, ED - Abnormal; Notable for the following components:   Glucose-Capillary 139 (*)    All other components within normal limits  SARS CORONAVIRUS 2 BY RT PCR (HOSPITAL ORDER, PERFORMED IN [facility])  ETHANOL  PROTIME-INR  APTT  DIFFERENTIAL  RAPID URINE DRUG SCREEN, HOSP PERFORMED  URINALYSIS, ROUTINE W REFLEX MICROSCOPIC  POC URINE PREG, ED  TYPE AND SCREEN  PREPARE RBC (CROSSMATCH)  ABO/RH    EKG None  Radiology CT ABDOMEN PELVIS WO CONTRAST  Result Date: 08/21/2020 CLINICAL DATA:  Abdominal trauma, code stroke. Fell 3 days ago. Patient brought in to ED confused. EXAM: CT CHEST, ABDOMEN AND PELVIS WITHOUT CONTRAST TECHNIQUE: Multidetector CT imaging of the chest, abdomen and pelvis was performed following the standard protocol without IV contrast. COMPARISON:  X-ray abdomen 08/10/2017, chest x-ray 08/12/2017. FINDINGS: CT CHEST FINDINGS Cardiovascular: No significant vascular findings. Normal heart size. No pericardial effusion. Hyperdense myocardium compared to the cardiac chambers suggestive of anemia. The thoracic aorta is normal in caliber. Mild atherosclerotic plaque. No surrounding fat stranding of the aorta. Mediastinum/Nodes: No enlarged mediastinal, hilar, or axillary lymph nodes. Thyroid gland, trachea, and esophagus demonstrate no significant findings. Moderate volume hiatal hernia. No bowel wall thickening or pneumatosis of the stomach. Lungs/Pleura: Expriatory phase of respiration. Few scattered pulmonary micro nodules. No pulmonary mass. No focal consolidation. No pleural effusion or pneumothorax. Musculoskeletal: No chest wall abnormality No  suspicious lytic or blastic osseous lesions. Multiple, old healed, nondisplaced rib fractures bilaterally. No acute displaced rib fracture. No acute displaced sternal fracture. Multilevel degenerative changes of the spine. Age-indeterminate compression fracture of the T11 vertebral body with greater than 40% height loss. CT ABDOMEN PELVIS FINDINGS Hepatobiliary: No focal liver abnormality is seen. Status post cholecystectomy. No biliary dilatation. Pancreas: Unremarkable. No pancreatic ductal dilatation or surrounding  inflammatory changes. Spleen: Normal in size without focal abnormality. Adrenals/Urinary Tract: No adrenal nodule bilaterally. No nephrolithiasis, no hydronephrosis, and no contour-deforming renal mass. No ureterolithiasis or hydroureter. The urinary bladder is unremarkable. Stomach/Bowel: Stomach is within normal limits. Appendix appears normal in caliber with and a punctate appendicolith at its tip. No evidence of bowel wall thickening, distention, or inflammatory changes. The cecum is noted to be medialized. Vascular/Lymphatic: No significant vascular findings are present. No fat stranding surrounding the aorta. Mild atherosclerotic plaque. No enlarged abdominal or pelvic lymph nodes. Reproductive: Uterus and bilateral adnexa are unremarkable. Other: No abdominal wall hernia or abnormality. No abdominopelvic ascites. Musculoskeletal: No abdominal wall hernia or abnormality. Fatty degeneration of the left tensor fascia lata muscle. Sclerotic lesion with rings and arcs morphology of the right S1 level (7:105, 3:89) as well as a nodular lesion that a similar within the left iliac bone (7:101). These lesions were previously identified on x-ray abdomen 08/10/2017 and therefore may represent bone island versus enchondromas. Otherwise no suspicious or new lytic or blastic osseous lesions. Multilevel degenerative changes of the spine. IMPRESSION: 1. No acute intrathoracic or intra-abdominal/intrapelvic  traumatic injury on this noncontrast study. 2. Age-indeterminate, possibly acute, T11 vertebral body fracture with greater than 40% height loss. Correlate with tenderness to palpation. 3. Moderate-sized hiatal hernia. 4.  Aortic Atherosclerosis (ICD10-I70.0). Electronically Signed   By: Tish FredericksonMorgane  Naveau M.D.   On: 08/21/2020 22:04   CT Chest Wo Contrast  Result Date: 08/21/2020 CLINICAL DATA:  Abdominal trauma, code stroke. Fell 3 days ago. Patient brought in to ED confused. EXAM: CT CHEST, ABDOMEN AND PELVIS WITHOUT CONTRAST TECHNIQUE: Multidetector CT imaging of the chest, abdomen and pelvis was performed following the standard protocol without IV contrast. COMPARISON:  X-ray abdomen 08/10/2017, chest x-ray 08/12/2017. FINDINGS: CT CHEST FINDINGS Cardiovascular: No significant vascular findings. Normal heart size. No pericardial effusion. Hyperdense myocardium compared to the cardiac chambers suggestive of anemia. The thoracic aorta is normal in caliber. Mild atherosclerotic plaque. No surrounding fat stranding of the aorta. Mediastinum/Nodes: No enlarged mediastinal, hilar, or axillary lymph nodes. Thyroid gland, trachea, and esophagus demonstrate no significant findings. Moderate volume hiatal hernia. No bowel wall thickening or pneumatosis of the stomach. Lungs/Pleura: Expriatory phase of respiration. Few scattered pulmonary micro nodules. No pulmonary mass. No focal consolidation. No pleural effusion or pneumothorax. Musculoskeletal: No chest wall abnormality No suspicious lytic or blastic osseous lesions. Multiple, old healed, nondisplaced rib fractures bilaterally. No acute displaced rib fracture. No acute displaced sternal fracture. Multilevel degenerative changes of the spine. Age-indeterminate compression fracture of the T11 vertebral body with greater than 40% height loss. CT ABDOMEN PELVIS FINDINGS Hepatobiliary: No focal liver abnormality is seen. Status post cholecystectomy. No biliary dilatation.  Pancreas: Unremarkable. No pancreatic ductal dilatation or surrounding inflammatory changes. Spleen: Normal in size without focal abnormality. Adrenals/Urinary Tract: No adrenal nodule bilaterally. No nephrolithiasis, no hydronephrosis, and no contour-deforming renal mass. No ureterolithiasis or hydroureter. The urinary bladder is unremarkable. Stomach/Bowel: Stomach is within normal limits. Appendix appears normal in caliber with and a punctate appendicolith at its tip. No evidence of bowel wall thickening, distention, or inflammatory changes. The cecum is noted to be medialized. Vascular/Lymphatic: No significant vascular findings are present. No fat stranding surrounding the aorta. Mild atherosclerotic plaque. No enlarged abdominal or pelvic lymph nodes. Reproductive: Uterus and bilateral adnexa are unremarkable. Other: No abdominal wall hernia or abnormality. No abdominopelvic ascites. Musculoskeletal: No abdominal wall hernia or abnormality. Fatty degeneration of the left tensor fascia lata muscle. Sclerotic  lesion with rings and arcs morphology of the right S1 level (7:105, 3:89) as well as a nodular lesion that a similar within the left iliac bone (7:101). These lesions were previously identified on x-ray abdomen 08/10/2017 and therefore may represent bone island versus enchondromas. Otherwise no suspicious or new lytic or blastic osseous lesions. Multilevel degenerative changes of the spine. IMPRESSION: 1. No acute intrathoracic or intra-abdominal/intrapelvic traumatic injury on this noncontrast study. 2. Age-indeterminate, possibly acute, T11 vertebral body fracture with greater than 40% height loss. Correlate with tenderness to palpation. 3. Moderate-sized hiatal hernia. 4.  Aortic Atherosclerosis (ICD10-I70.0). Electronically Signed   By: Tish Frederickson M.D.   On: 08/21/2020 22:04   CT HEAD CODE STROKE WO CONTRAST  Result Date: 08/21/2020 CLINICAL DATA:  Code stroke. Initial evaluation for acute  altered mental status, confusion. EXAM: CT HEAD WITHOUT CONTRAST TECHNIQUE: Contiguous axial images were obtained from the base of the skull through the vertex without intravenous contrast. COMPARISON:  None available. FINDINGS: Brain: Age-related cerebral atrophy with mild chronic small vessel ischemic disease. Scattered areas of multifocal encephalomalacia involving the anterior left frontotemporal region most likely related to remote trauma. No acute intracranial hemorrhage. No acute large vessel territory infarct. No mass lesion, mass effect, or midline shift. No hydrocephalus or extra-axial fluid collection. Vascular: No hyperdense vessel. Skull: Scalp soft tissues within normal limits. Calvarium intact. Postsurgical changes partially visualize within the upper cervical spine. Sinuses/Orbits: Globes and orbital soft tissues within normal limits. Paranasal sinuses are clear. No mastoid effusion. Other: None. ASPECTS [place] Stroke Program Early CT Score) - Ganglionic level infarction (caudate, lentiform nuclei, internal capsule, insula, M1-M3 cortex): 7 - Supraganglionic infarction (M4-M6 cortex): 3 Total score (0-10 with 10 being normal): 10 IMPRESSION: 1. No acute intracranial infarct or other abnormality. 2. ASPECTS is 10. 3. Chronic left frontotemporal encephalomalacia, most likely related to remote trauma. 4. Underlying age-related cerebral atrophy with mild chronic small vessel ischemic disease. Critical Value/emergent results were called by telephone at the time of interpretation on 08/21/2020 at 9:37 pm to provider Nautika Cressey , who verbally acknowledged these results. Electronically Signed   By: Rise Mu M.D.   On: 08/21/2020 21:39    Procedures Procedures (including critical care time)  Medications Ordered in ED Medications  sodium chloride 0.9 % bolus 1,000 mL (0 mLs Intravenous Stopped 08/21/20 2320)  0.9 %  sodium chloride infusion (10 mL/hr Intravenous New Bag/Given 08/21/20  2326)    ED Course  I have reviewed the triage vital signs and the nursing notes.  Pertinent labs & imaging results that were available during my care of the patient were reviewed by me and considered in my medical decision making (see chart for details).    CRITICAL CARE Performed by: Bethann Berkshire Total critical care time: 45 minutes Critical care time was exclusive of separately billable procedures and treating other patients. Critical care was necessary to treat or prevent imminent or life-threatening deterioration. Critical care was time spent personally by me on the following activities: development of treatment plan with patient and/or surrogate as well as nursing, discussions with consultants, evaluation of patient's response to treatment, examination of patient, obtaining history from patient or surrogate, ordering and performing treatments and interventions, ordering and review of laboratory studies, ordering and review of radiographic studies, pulse oximetry and re-evaluation of patient's condition.    Patient with a code stroke.  Patient was seen by neurology and it was felt the patient should get an MRI MRA to rule out large  vessel disease.  Patient cannot get a CT angio because she has severe allergic reaction.  Patient's hemoglobin came back 3.7 but her vital signs were normal and a rectal exam was heme-negative.  It was decided by me and the neurologist that the patient should get at least 2 units of blood before she is transported to [facility] for MRI MRA.  I spoke with the hospitalist who is going to admit the patient and will take care of the transfer when as needed      The phone number for the telemetry neurologist is [phone] and she stated she would be glad to speak to anybody if there was any questions       This patient presents to the ED for concern of altered mental status, this involves an extensive number of treatment options, and is a complaint that  carries with it a high risk of complications and morbidity.  The differential diagnosis includes CVA   Lab Tests:   I Ordered, reviewed, and interpreted labs, which included CBC and chemistries that showed anemia at 3.7 hemoglobin  Medicines ordered:   I ordered medication blood transfusion  Imaging Studies ordered:   I ordered imaging studies which included CT head chest and abdomen  I independently visualized and interpreted imaging which showed no acute findings in the head but patient does have fatigue 11 compression fracture unknown time  Additional history obtained:   Additional history obtained from records  Previous records obtained and reviewed.  Consultations Obtained:   I consulted neurology and hospitalist and discussed lab and imaging findings  Reevaluation:  After the interventions stated above, I reevaluated the patient and found unchanged  Critical Interventions:  Marland Kitchen   MDM Rules/Calculators/A&P                          Patient with stroke and anemia. Final Clinical Impression(s) / ED Diagnoses Final diagnoses:  Cerebellar stroke, acute [facility])    Rx / DC Orders ED Discharge Orders    None       Bethann Berkshire, MD 08/22/20 1037

## 2020-08-21 NOTE — ED Notes (Signed)
Date and time results received: 08/21/20 22:0  Test: hemoglobin Critical Value: 3.7  Name of Provider Notified: Zammit   Orders Received? Or Actions Taken?: lab to re-draw

## 2020-08-21 NOTE — ED Notes (Signed)
Date and time results received: 08/21/20 2249 (use smartphrase ".now" to insert current time)  Test: Hgb Critical Value: 3.7  Name of Provider Notified: Estell Harpin, MD  Orders Received? Or Actions Taken?:

## 2020-08-21 NOTE — ED Triage Notes (Signed)
Patient brought in by [facility] for possible CODE Stroke. Last seen normal 14:30. Patient fell 3 days ago and was seen at the Urgent Care . Patient woke up 18:30 tonight and was completely confused. EMS states patient was altered upon their arrival. EMS reassessed and patient then had expressive aphasia. CBG 174. Pupils equal and reactive. Patient denies any weakness and is alert.

## 2020-08-22 ENCOUNTER — Observation Stay (HOSPITAL_COMMUNITY): Payer: Medicare HMO

## 2020-08-22 ENCOUNTER — Encounter (HOSPITAL_COMMUNITY): Payer: Self-pay | Admitting: Internal Medicine

## 2020-08-22 DIAGNOSIS — S065X9S Traumatic subdural hemorrhage with loss of consciousness of unspecified duration, sequela: Secondary | ICD-10-CM | POA: Diagnosis not present

## 2020-08-22 DIAGNOSIS — E669 Obesity, unspecified: Secondary | ICD-10-CM

## 2020-08-22 DIAGNOSIS — R413 Other amnesia: Secondary | ICD-10-CM | POA: Diagnosis present

## 2020-08-22 DIAGNOSIS — R29810 Facial weakness: Secondary | ICD-10-CM | POA: Diagnosis present

## 2020-08-22 DIAGNOSIS — D509 Iron deficiency anemia, unspecified: Secondary | ICD-10-CM | POA: Diagnosis present

## 2020-08-22 DIAGNOSIS — G459 Transient cerebral ischemic attack, unspecified: Secondary | ICD-10-CM | POA: Diagnosis not present

## 2020-08-22 DIAGNOSIS — K219 Gastro-esophageal reflux disease without esophagitis: Secondary | ICD-10-CM | POA: Diagnosis present

## 2020-08-22 DIAGNOSIS — K2970 Gastritis, unspecified, without bleeding: Secondary | ICD-10-CM | POA: Diagnosis present

## 2020-08-22 DIAGNOSIS — R8271 Bacteriuria: Secondary | ICD-10-CM | POA: Diagnosis present

## 2020-08-22 DIAGNOSIS — K922 Gastrointestinal hemorrhage, unspecified: Secondary | ICD-10-CM | POA: Diagnosis present

## 2020-08-22 DIAGNOSIS — M7918 Myalgia, other site: Secondary | ICD-10-CM | POA: Diagnosis present

## 2020-08-22 DIAGNOSIS — T402X5A Adverse effect of other opioids, initial encounter: Secondary | ICD-10-CM | POA: Diagnosis present

## 2020-08-22 DIAGNOSIS — W19XXXA Unspecified fall, initial encounter: Secondary | ICD-10-CM | POA: Diagnosis present

## 2020-08-22 DIAGNOSIS — R824 Acetonuria: Secondary | ICD-10-CM | POA: Diagnosis present

## 2020-08-22 DIAGNOSIS — G8929 Other chronic pain: Secondary | ICD-10-CM | POA: Diagnosis present

## 2020-08-22 DIAGNOSIS — M79672 Pain in left foot: Secondary | ICD-10-CM | POA: Diagnosis present

## 2020-08-22 DIAGNOSIS — M4854XA Collapsed vertebra, not elsewhere classified, thoracic region, initial encounter for fracture: Secondary | ICD-10-CM | POA: Diagnosis present

## 2020-08-22 DIAGNOSIS — S22080A Wedge compression fracture of T11-T12 vertebra, initial encounter for closed fracture: Secondary | ICD-10-CM | POA: Diagnosis present

## 2020-08-22 DIAGNOSIS — Z6832 Body mass index (BMI) 32.0-32.9, adult: Secondary | ICD-10-CM | POA: Diagnosis not present

## 2020-08-22 DIAGNOSIS — M545 Low back pain: Secondary | ICD-10-CM | POA: Diagnosis present

## 2020-08-22 DIAGNOSIS — Z79899 Other long term (current) drug therapy: Secondary | ICD-10-CM | POA: Diagnosis not present

## 2020-08-22 DIAGNOSIS — G629 Polyneuropathy, unspecified: Secondary | ICD-10-CM | POA: Diagnosis present

## 2020-08-22 DIAGNOSIS — K449 Diaphragmatic hernia without obstruction or gangrene: Secondary | ICD-10-CM | POA: Diagnosis present

## 2020-08-22 DIAGNOSIS — Z20822 Contact with and (suspected) exposure to covid-19: Secondary | ICD-10-CM | POA: Diagnosis present

## 2020-08-22 DIAGNOSIS — G92 Toxic encephalopathy: Secondary | ICD-10-CM | POA: Diagnosis present

## 2020-08-22 DIAGNOSIS — D62 Acute posthemorrhagic anemia: Secondary | ICD-10-CM | POA: Diagnosis present

## 2020-08-22 DIAGNOSIS — D649 Anemia, unspecified: Secondary | ICD-10-CM

## 2020-08-22 DIAGNOSIS — T48205A Adverse effect of unspecified drugs acting on muscles, initial encounter: Secondary | ICD-10-CM | POA: Diagnosis present

## 2020-08-22 HISTORY — DX: Obesity, unspecified: E66.9

## 2020-08-22 LAB — LIPID PANEL
Cholesterol: 121 mg/dL (ref 0–200)
HDL: 29 mg/dL — ABNORMAL LOW
LDL Cholesterol: 70 mg/dL (ref 0–99)
Total CHOL/HDL Ratio: 4.2 ratio
Triglycerides: 110 mg/dL
VLDL: 22 mg/dL (ref 0–40)

## 2020-08-22 LAB — URINALYSIS, ROUTINE W REFLEX MICROSCOPIC
Bilirubin Urine: NEGATIVE
Glucose, UA: NEGATIVE mg/dL
Hgb urine dipstick: NEGATIVE
Ketones, ur: 20 mg/dL — AB
Leukocytes,Ua: NEGATIVE
Nitrite: POSITIVE — AB
Protein, ur: NEGATIVE mg/dL
Specific Gravity, Urine: 1.015 (ref 1.005–1.030)
pH: 8 (ref 5.0–8.0)

## 2020-08-22 LAB — ECHOCARDIOGRAM COMPLETE
AR max vel: 2.22 cm2
AV Area VTI: 1.79 cm2
AV Area mean vel: 1.87 cm2
AV Mean grad: 7.1 mmHg
AV Peak grad: 12.8 mmHg
Ao pk vel: 1.79 m/s
Area-P 1/2: 4.06 cm2
Height: 61 in
S' Lateral: 2.35 cm
Weight: 2768.98 oz

## 2020-08-22 LAB — CBC
HCT: 26.8 % — ABNORMAL LOW (ref 36.0–46.0)
Hemoglobin: 7.5 g/dL — ABNORMAL LOW (ref 12.0–15.0)
MCH: 22.5 pg — ABNORMAL LOW (ref 26.0–34.0)
MCHC: 28 g/dL — ABNORMAL LOW (ref 30.0–36.0)
MCV: 80.5 fL (ref 80.0–100.0)
Platelets: 339 K/uL (ref 150–400)
RBC: 3.33 MIL/uL — ABNORMAL LOW (ref 3.87–5.11)
RDW: 22.2 % — ABNORMAL HIGH (ref 11.5–15.5)
WBC: 9.2 K/uL (ref 4.0–10.5)
nRBC: 1.1 % — ABNORMAL HIGH (ref 0.0–0.2)

## 2020-08-22 LAB — HEMOGLOBIN A1C
Hgb A1c MFr Bld: 5 % (ref 4.8–5.6)
Mean Plasma Glucose: 96.8 mg/dL

## 2020-08-22 LAB — RAPID URINE DRUG SCREEN, HOSP PERFORMED
Amphetamines: NOT DETECTED
Barbiturates: NOT DETECTED
Benzodiazepines: NOT DETECTED
Cocaine: NOT DETECTED
Opiates: POSITIVE — AB
Tetrahydrocannabinol: POSITIVE — AB

## 2020-08-22 LAB — POC OCCULT BLOOD, ED: Fecal Occult Bld: NEGATIVE

## 2020-08-22 LAB — HIV ANTIBODY (ROUTINE TESTING W REFLEX): HIV Screen 4th Generation wRfx: NONREACTIVE

## 2020-08-22 IMAGING — MR MR MRA HEAD W/O CM
4 of 6 series · 20 of 48 positions shown · IV contrast (gadavist)
Comparison: [DATE] CT head code stroke.

CLINICAL DATA: Ataxia, neuro deficit, acute.

EXAM:
MRI HEAD WITHOUT CONTRAST
MRA HEAD WITHOUT CONTRAST
MRA NECK WITHOUT AND WITH CONTRAST
TECHNIQUE: Multiplanar, multiecho pulse sequences of the brain and surrounding
structures were obtained without intravenous contrast. Angiographic
images of the Circle of Willis were obtained using MRA technique
without intravenous contrast. Angiographic images of the neck were
obtained using MRA technique without and with intravenous contrast.
Carotid stenosis measurements (when applicable) are obtained
utilizing NASCET criteria, using the distal internal carotid
diameter as the denominator.
CONTRAST:  7mL GADAVIST GADOBUTROL 1 MMOL/ML IV SOLN

[Series 1035: tumble · 0.34mm/px · 3 of 5 slices shown (1 of 2)]
[im 1/5]
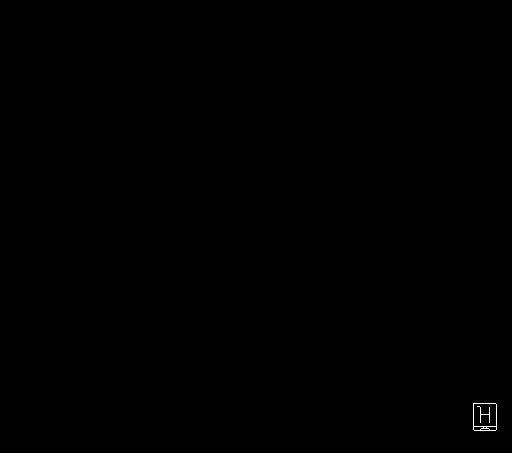
[im 3/5]
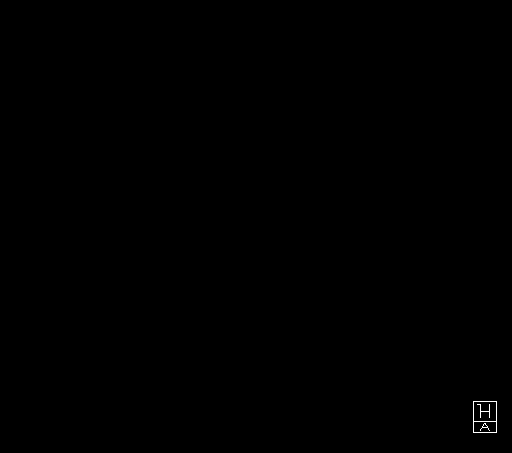
[im 5/5]
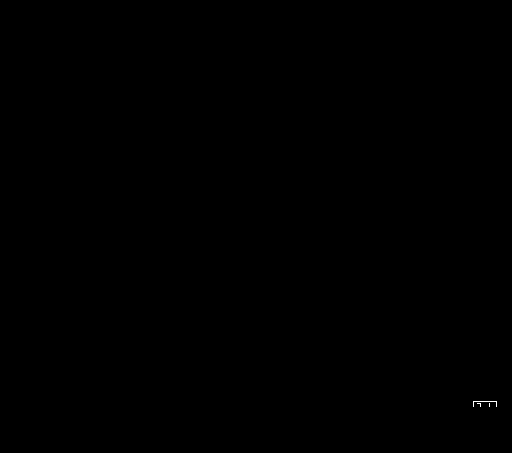

[Series 1039: rotate · 0.34mm/px · 6 of 8 slices shown (1 of 2)]
[im 1/8]
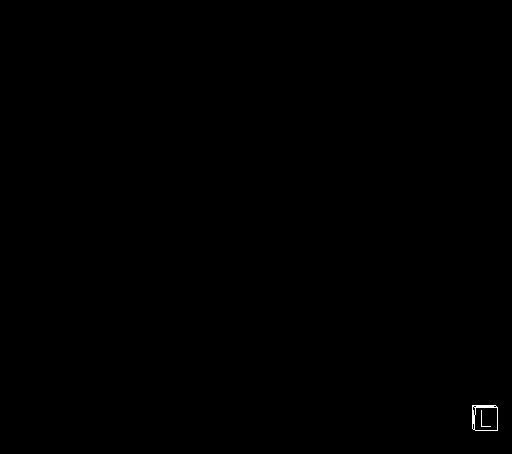
[im 2/8]
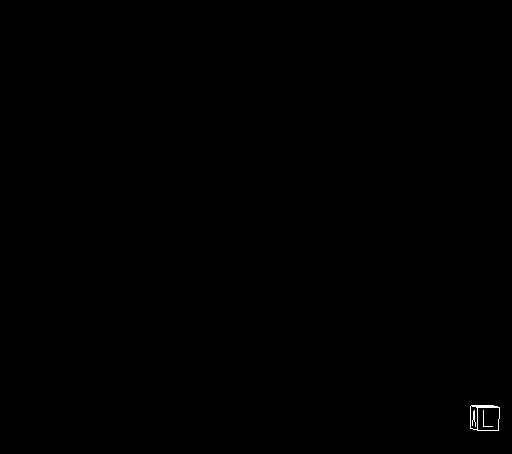
[im 3/8]
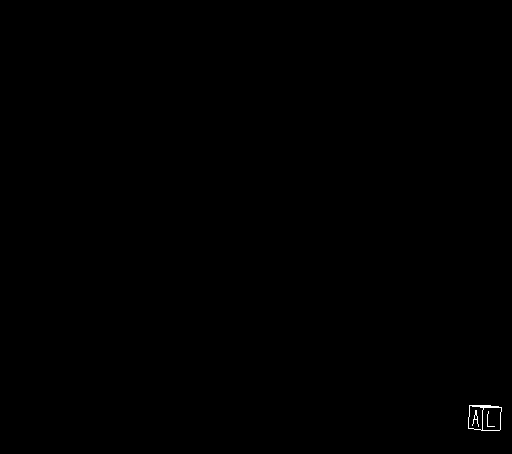
[im 5/8]
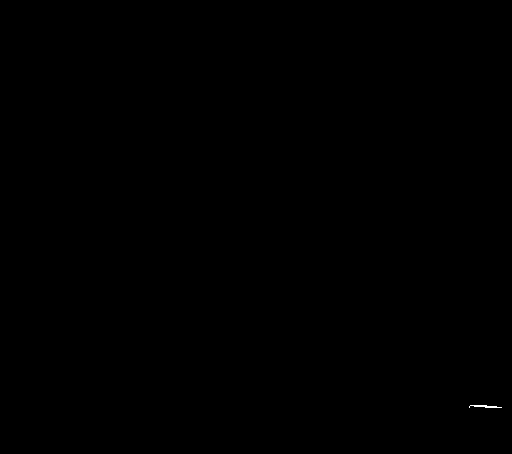
[im 6/8]
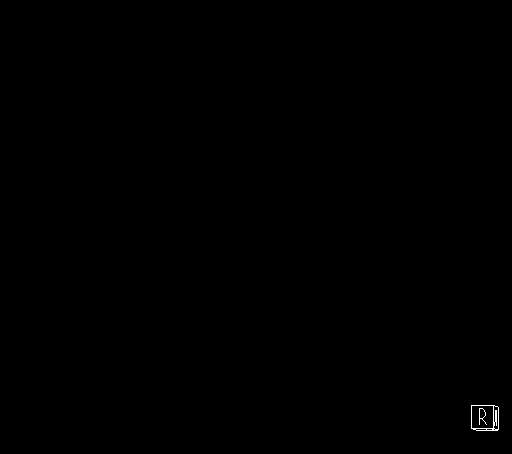
[im 8/8]
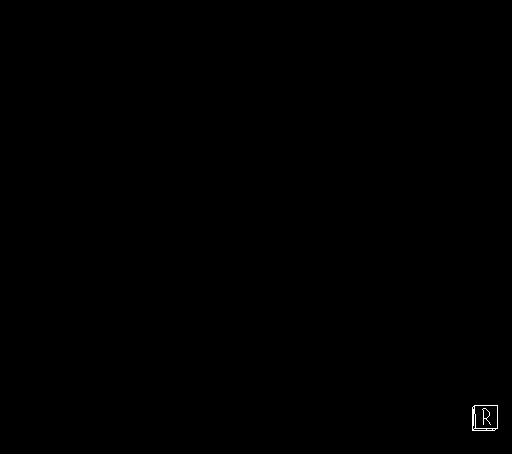

[Series 1046: tumble · 0.34mm/px · 7 of 10 slices shown (2 of 2)]
[im 1/10]
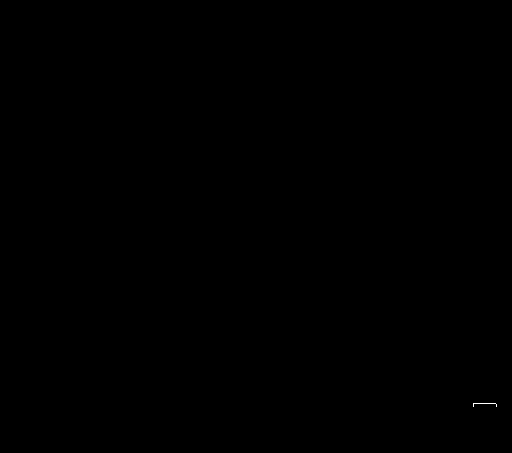
[im 2/10]
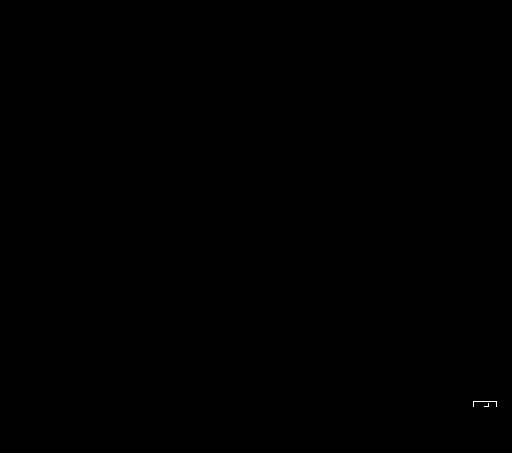
[im 4/10]
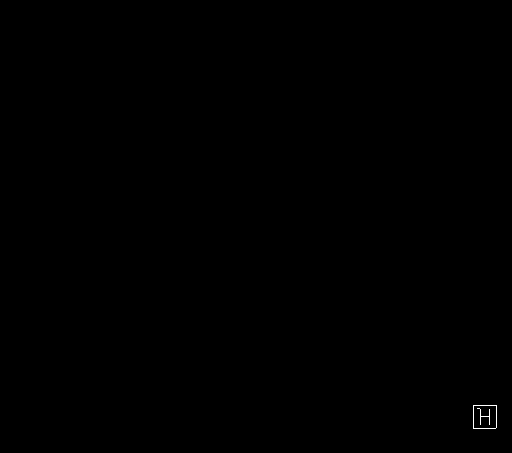
[im 5/10]
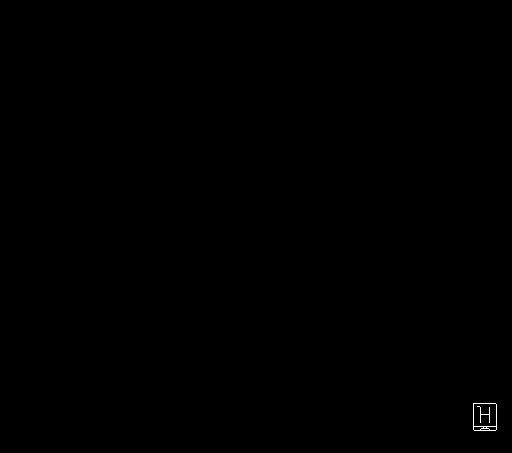
[im 7/10]
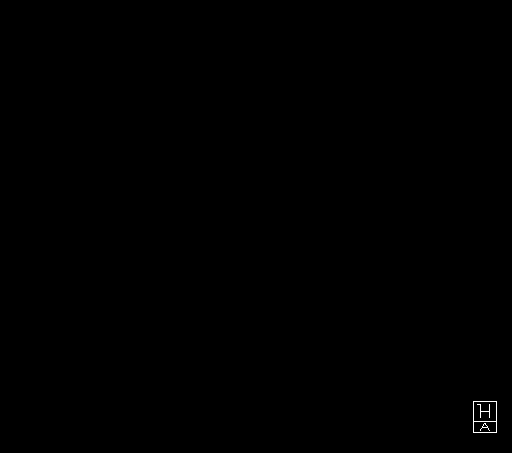
[im 8/10]
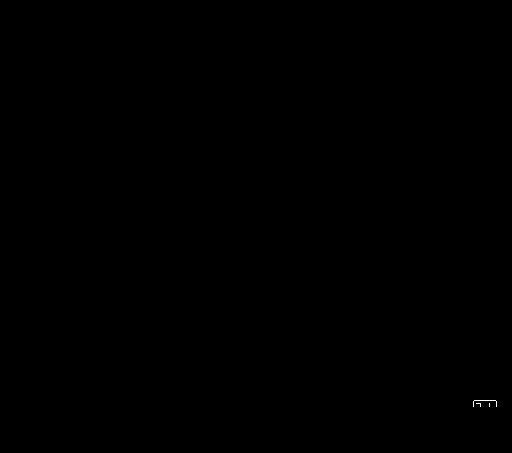
[im 10/10]
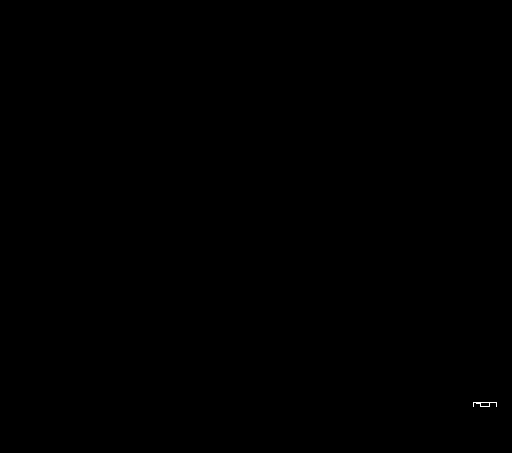

[Series 1050: rotate · 0.34mm/px · 4 of 8 slices shown (2 of 2)]
[im 1/8]
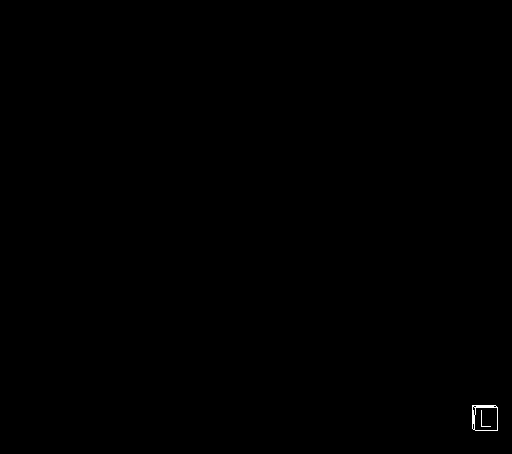
[im 2/8]
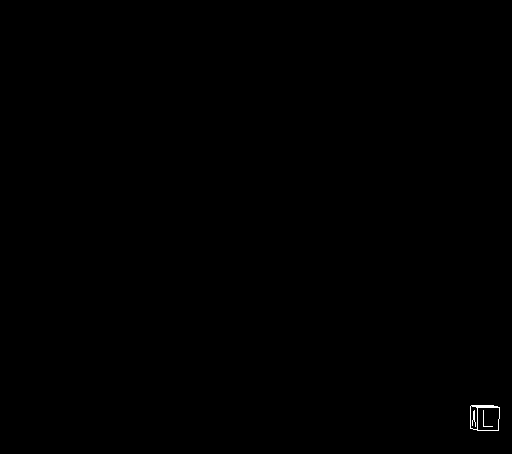
[im 5/8]
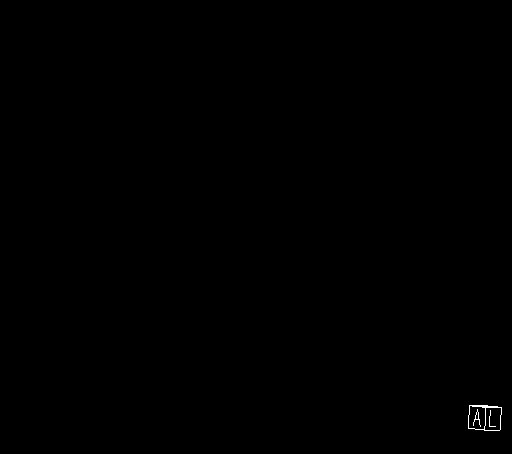
[im 8/8]
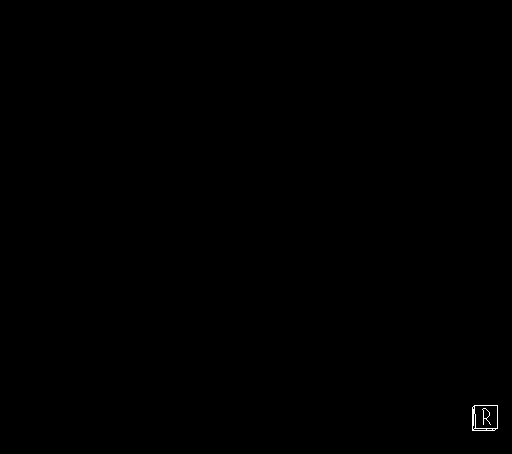

[20 of 48 positions shown; findings below may reference images not displayed]

FINDINGS: MRI HEAD FINDINGS

Brain: No diffusion-weighted signal abnormality. Sequela of prior
bilateral frontal and left temporal insults with superficial
siderosis. No acute intracranial hemorrhage. Mild cerebral atrophy
with ex vacuo dilatation. Minimal chronic microvascular ischemic
changes. No midline shift, ventriculomegaly or extra-axial fluid
collection. No mass lesion.

Vascular: Please see MRA head.

Skull and upper cervical spine: Normal marrow signal.

Sinuses/Orbits: Normal orbits. Tiny right maxillary sinus mucous
retention cyst. Trace right mastoid effusion.

Other: None.

MRA HEAD FINDINGS

Anterior circulation: Left A1 segment hypoplasia. The bilateral
Pcomms are either hypoplastic or absent. No significant stenosis,
proximal occlusion, aneurysm, or vascular malformation.

Posterior circulation: Dominant right vertebral artery. Dominant
right AICA. No significant stenosis, proximal occlusion, aneurysm,
or vascular malformation.

Venous sinuses: No evidence of thrombosis.

Anatomic variants: As detailed above.

MRA NECK FINDINGS

There is no high-grade narrowing or focal aneurysm involving the
bilateral carotid arteries. No evidence of dissection.
Retropharyngeal right bifurcation and right common carotid artery.
Retropharyngeal bilateral proximal ECAs and ICAs.

The bilateral vertebral arteries are patent and demonstrate
antegrade flow. Dominant right vertebral artery. No evidence of
high-grade narrowing or focal aneurysm.

Three vessel aortic arch.
IMPRESSION: No acute intracranial process.

Sequela of remote bilateral frontal and left temporal insults. Left
frontal superficial siderosis.

No evidence of occlusion, high-grade narrowing, dissection or
aneurysm involving the major intracranial or neck vessels.

## 2020-08-22 IMAGING — MR MR MRA NECK WO/W CM
19 of 20 series · 37 of 48 positions shown · IV contrast (gadavist)
Comparison: [DATE] CT head code stroke.

CLINICAL DATA: Ataxia, neuro deficit, acute.

EXAM:
MRI HEAD WITHOUT CONTRAST
MRA HEAD WITHOUT CONTRAST
MRA NECK WITHOUT AND WITH CONTRAST
TECHNIQUE: Multiplanar, multiecho pulse sequences of the brain and surrounding
structures were obtained without intravenous contrast. Angiographic
images of the Circle of Willis were obtained using MRA technique
without intravenous contrast. Angiographic images of the neck were
obtained using MRA technique without and with intravenous contrast.
Carotid stenosis measurements (when applicable) are obtained
utilizing NASCET criteria, using the distal internal carotid
diameter as the denominator.
CONTRAST:  7mL GADAVIST GADOBUTROL 1 MMOL/ML IV SOLN

[Series 5: DWI · axial · 4.0mm · 0.88mm/px · 1 of 36 slices shown (1 of 6)]
[im 1/36]
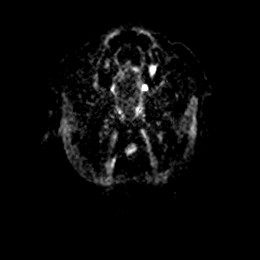

[Series 5: DWI · axial · 4.0mm · 0.88mm/px · 1 of 35 slices shown (2 of 6)]
[im 1/35]
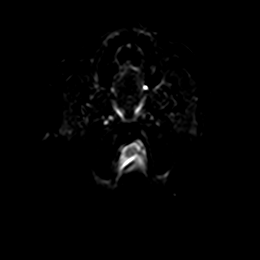

[Series 6: DWI · axial · 4.0mm · 0.88mm/px · z∈[-101,+38]mm · 2 of 36 slices shown (3 of 6)]
[im 1/36]
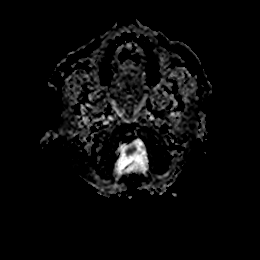
[im 36/36]
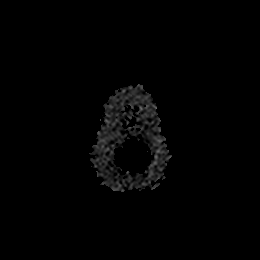

[Series 7: DWI · coronal · 4.0mm · 0.88mm/px · 1 of 32 slices shown (4 of 6)]
[im 1/32]
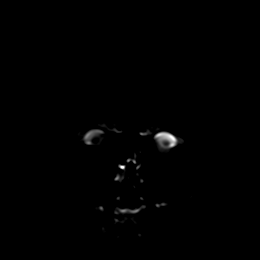

[Series 7: DWI · coronal · 4.0mm · 0.88mm/px · 1 of 32 slices shown (5 of 6)]
[im 1/32]
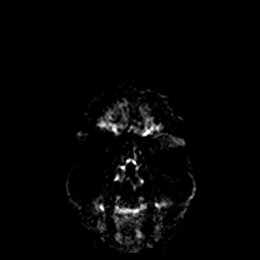

[Series 8: DWI · coronal · 4.0mm · 0.88mm/px · 1 of 32 slices shown (6 of 6)]
[im 1/32]
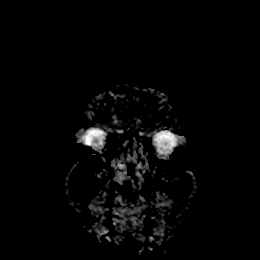

[Series 9: T1 · sagittal · 5.0mm · 0.94mm/px · 1 of 25 slices shown]
[im 1/25]
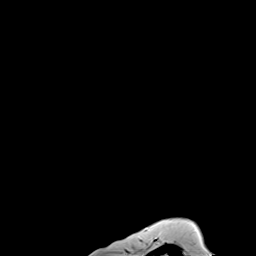

[Series 15: T2 · axial · 5.0mm · 0.72mm/px · 1 of 20 slices shown]
[im 1/20]
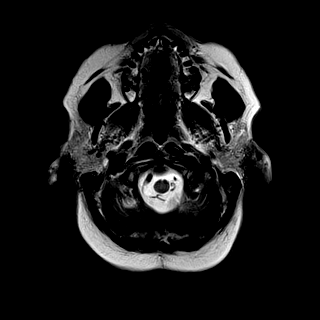

[Series 16: ax hemo · axial · 5.0mm · 0.86mm/px · 1 of 25 slices shown]
[im 1/25]
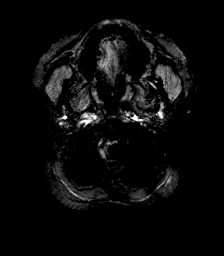

[Series 17: FLAIR · axial · 4.0mm · 0.43mm/px · 1 of 32 slices shown]
[im 1/32]
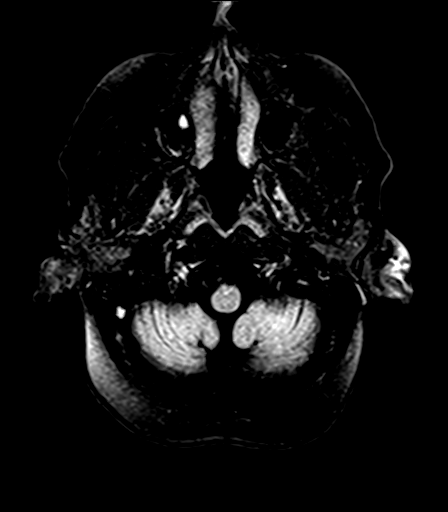

[Series 25: tof_fl3d_tra_iso · axial · 0.6mm · 0.52mm/px · z∈[-215,-137]mm · 6 of 133 slices shown]
[im 1/133]
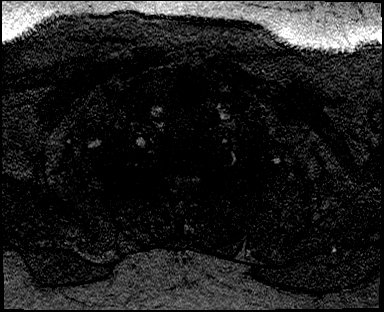
[im 27/133]
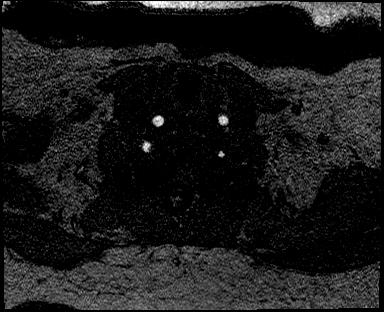
[im 53/133]
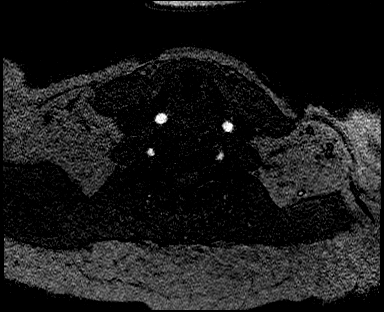
[im 80/133]
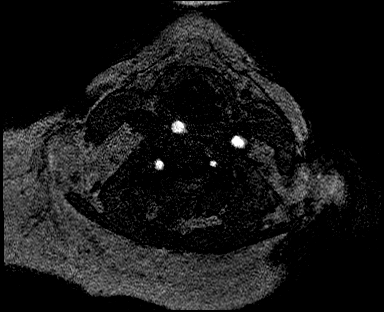
[im 106/133]
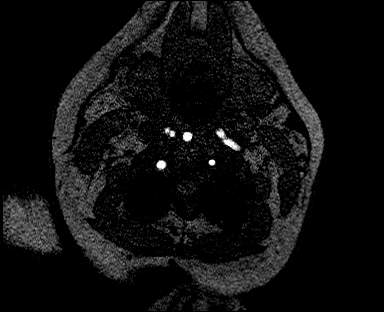
[im 133/133]
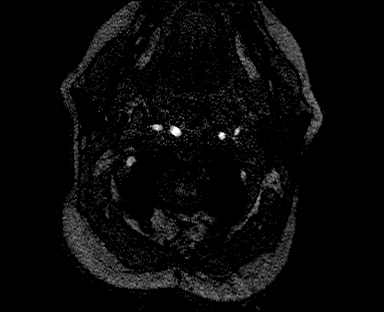

[Series 28: angio_fl3d_cor_pre_ttc=3.0s · coronal · 0.9mm · 0.85mm/px · 4 of 80 slices shown]
[im 1/80]
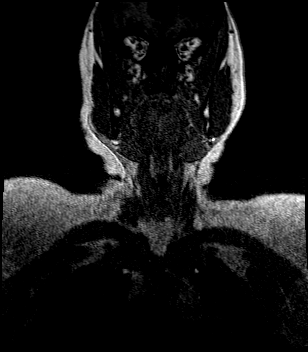
[im 27/80]
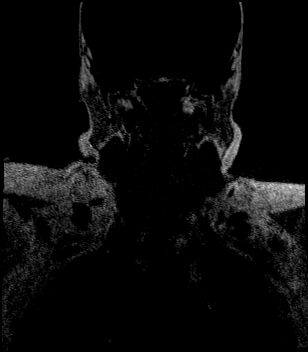
[im 53/80]
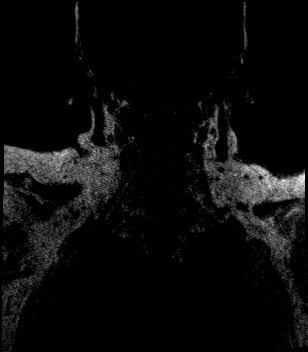
[im 80/80]
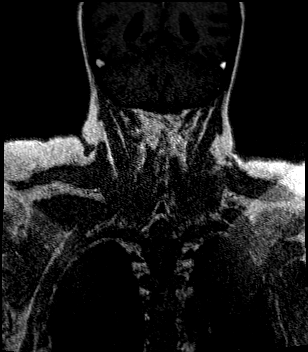

[Series 31: angio_fl3d_cor_post_ttc=3.0s · coronal · 0.9mm · 0.85mm/px · 4 of 80 slices shown]
[im 1/80]
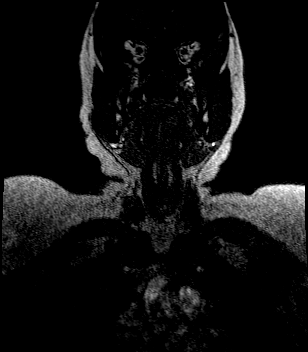
[im 27/80]
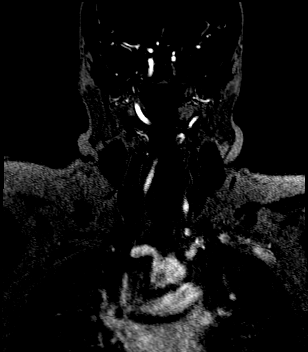
[im 53/80]
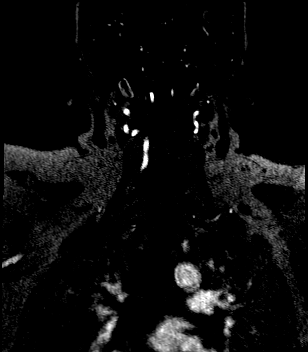
[im 80/80]
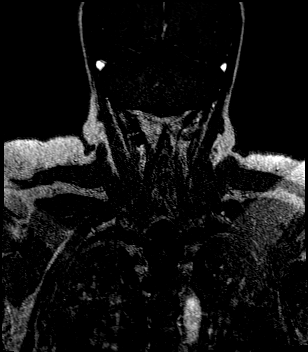

[Series 32: angio_fl3d_cor_post_ttc=3.0s_moco-adv · coronal · 0.9mm · 0.85mm/px · 4 of 80 slices shown]
[im 1/80]
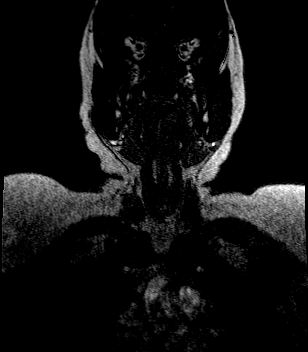
[im 27/80]
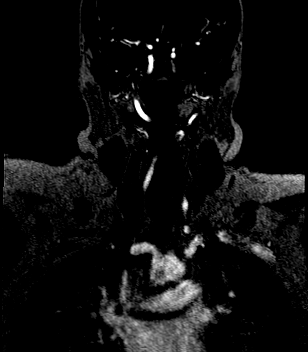
[im 53/80]
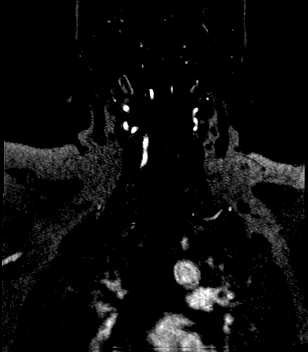
[im 80/80]
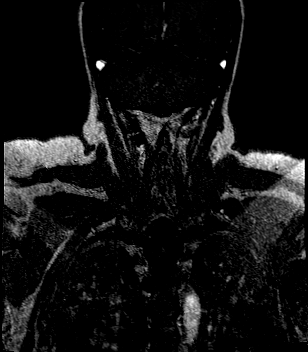

[Series 33: angio_fl3d_cor_post_ttc=3.0s_moco-adv_sub · coronal · 0.9mm · 0.85mm/px · 4 of 80 slices shown]
[im 1/80]
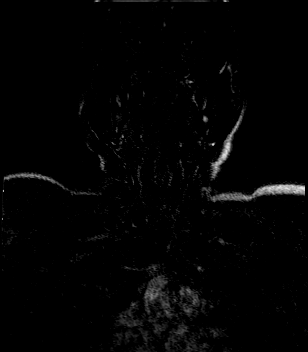
[im 27/80]
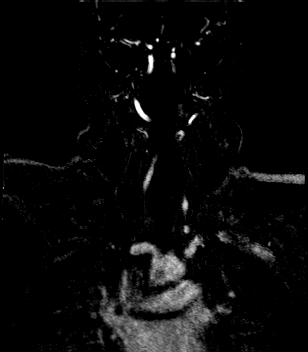
[im 53/80]
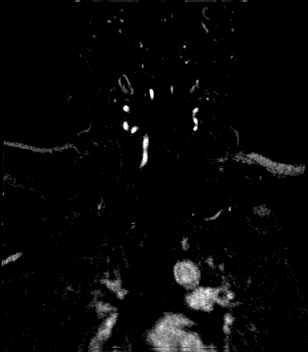
[im 80/80]
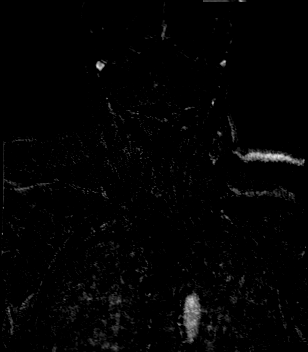

[Series 1006: rotate · 0.34mm/px · 1 of 13 slices shown (1 of 4)]
[im 1/13]
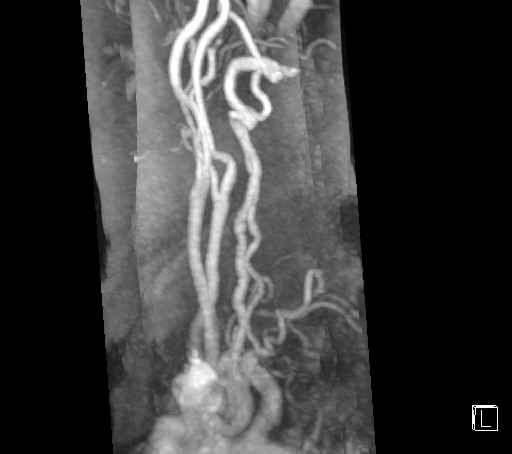

[Series 1012: rotate · 0.34mm/px · 1 of 19 slices shown (2 of 4)]
[im 1/19]
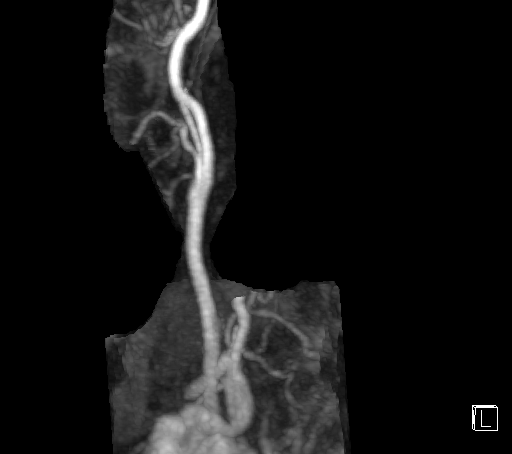

[Series 1016: rotate · 0.34mm/px · 1 of 19 slices shown (3 of 4)]
[im 1/19]
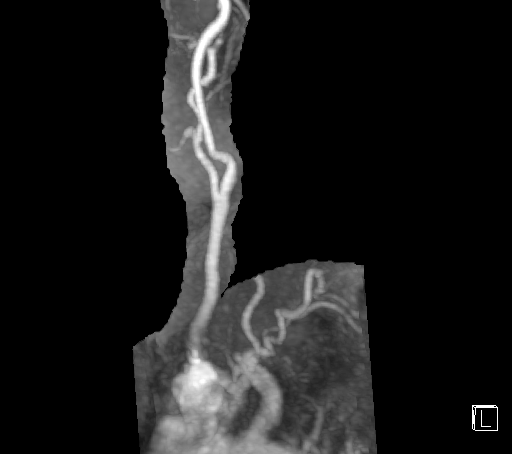

[Series 1020: rotate · 0.34mm/px · 1 of 12 slices shown (4 of 4)]
[im 1/12]
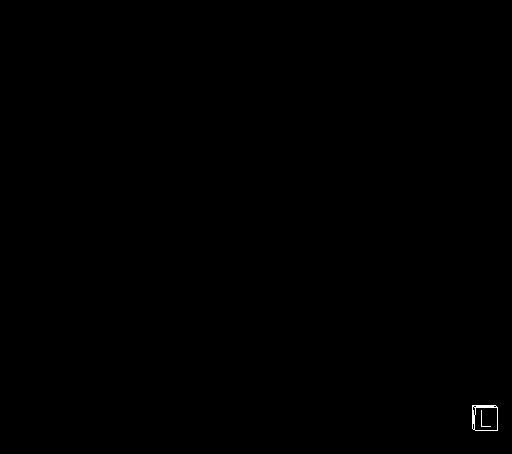

[37 of 48 positions shown; findings below may reference images not displayed]

FINDINGS: MRI HEAD FINDINGS

Brain: No diffusion-weighted signal abnormality. Sequela of prior
bilateral frontal and left temporal insults with superficial
siderosis. No acute intracranial hemorrhage. Mild cerebral atrophy
with ex vacuo dilatation. Minimal chronic microvascular ischemic
changes. No midline shift, ventriculomegaly or extra-axial fluid
collection. No mass lesion.

Vascular: Please see MRA head.

Skull and upper cervical spine: Normal marrow signal.

Sinuses/Orbits: Normal orbits. Tiny right maxillary sinus mucous
retention cyst. Trace right mastoid effusion.

Other: None.

MRA HEAD FINDINGS

Anterior circulation: Left A1 segment hypoplasia. The bilateral
Pcomms are either hypoplastic or absent. No significant stenosis,
proximal occlusion, aneurysm, or vascular malformation.

Posterior circulation: Dominant right vertebral artery. Dominant
right AICA. No significant stenosis, proximal occlusion, aneurysm,
or vascular malformation.

Venous sinuses: No evidence of thrombosis.

Anatomic variants: As detailed above.

MRA NECK FINDINGS

There is no high-grade narrowing or focal aneurysm involving the
bilateral carotid arteries. No evidence of dissection.
Retropharyngeal right bifurcation and right common carotid artery.
Retropharyngeal bilateral proximal ECAs and ICAs.

The bilateral vertebral arteries are patent and demonstrate
antegrade flow. Dominant right vertebral artery. No evidence of
high-grade narrowing or focal aneurysm.

Three vessel aortic arch.
IMPRESSION: No acute intracranial process.

Sequela of remote bilateral frontal and left temporal insults. Left
frontal superficial siderosis.

No evidence of occlusion, high-grade narrowing, dissection or
aneurysm involving the major intracranial or neck vessels.

## 2020-08-22 MED ORDER — PANTOPRAZOLE SODIUM 40 MG IV SOLR
40.0000 mg | Freq: Two times a day (BID) | INTRAVENOUS | Status: DC
  Administered 2020-08-22 – 2020-08-23 (×4): 40 mg via INTRAVENOUS
  Filled 2020-08-22 (×4): qty 40

## 2020-08-22 MED ORDER — LORAZEPAM 2 MG/ML IJ SOLN: 0.5000 mg | Freq: Once | INTRAMUSCULAR | Status: DC | PRN

## 2020-08-22 MED ORDER — STROKE: EARLY STAGES OF RECOVERY BOOK
Freq: Once | Status: DC
  Filled 2020-08-22: qty 1

## 2020-08-22 MED ORDER — ONDANSETRON HCL 4 MG/2ML IJ SOLN
4.0000 mg | Freq: Four times a day (QID) | INTRAMUSCULAR | Status: DC | PRN
  Administered 2020-08-22 – 2020-08-27 (×2): 4 mg via INTRAVENOUS
  Filled 2020-08-22 (×2): qty 2

## 2020-08-22 MED ORDER — FENTANYL CITRATE (PF) 100 MCG/2ML IJ SOLN
25.0000 ug | Freq: Once | INTRAMUSCULAR | Status: AC
  Administered 2020-08-22: 25 ug via INTRAVENOUS
  Filled 2020-08-22: qty 2

## 2020-08-22 MED ORDER — LACTATED RINGERS IV SOLN: INTRAVENOUS | Status: DC

## 2020-08-22 MED ORDER — GADOBUTROL 1 MMOL/ML IV SOLN
7.0000 mL | Freq: Once | INTRAVENOUS | Status: AC | PRN
  Administered 2020-08-22: 7 mL via INTRAVENOUS

## 2020-08-22 MED ORDER — HYDROMORPHONE HCL 1 MG/ML IJ SOLN
1.0000 mg | INTRAMUSCULAR | Status: DC | PRN
  Administered 2020-08-22 – 2020-08-28 (×28): 1 mg via INTRAVENOUS
  Filled 2020-08-22 (×28): qty 1

## 2020-08-22 NOTE — Progress Notes (Signed)
*  PRELIMINARY RESULTS* Echocardiogram 2D Echocardiogram has been performed.  Kylie Wells 08/22/2020, 3:41 PM
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
## 2020-08-22 NOTE — Progress Notes (Signed)
PROGRESS NOTE    Kylie Wells  ONG:[id number] DOB: [birth year] DOA: 08/21/2020 PCP: Pcp, No   Brief Narrative:  Per HPI: Kylie Wells is a 54 y.o. female with medical history significant of GERD, TBI in 2018 who was brought to the emergency department via EMS due to a possible occult stroke.  Her husband stated that he last saw her normal around 1430 earlier today, she took hydrocodone and a muscle relaxant earlier and went to sleep.  When she woke up about 4 hours later, she was very confused.  EMS stated that she was altered on their initial assessment.  The patient has been taking analgesics and muscle relaxants after having a fall over the weekend and diagnosed with musculoskeletal pain on her back.  Her husband states that she has been taking a lot of ibuprofen until recently due to chronic left foot pain.  She was scheduled to have surgery on her left foot to try to correct this issue, but this was postponed due to the COVID-19 delta variant healthcare crisis.  They deny vaginal bleed, melena or hematemesis.  The husband stated that she has been sleeping more than usual.  He also noticed that her systolic BP was in the low 100s when she was at the urgent care, which is unusual for her.  No fever, chills, headache, sore throat, rhinorrhea, wheezing or hemoptysis.  No chest pain, palpitations, diaphoresis, PND, orthopnea or recent pitting edema of the lower extremities.  No abdominal pain, diarrhea, constipation, melena or hematochezia.  No dysuria, frequency or hematuria.  Denies polyuria, polydipsia, polyphagia or blurred vision.  9/23: According to husband at bedside, patient had a fall several days ago on Saturday of last week after which point in time she had excruciating low back pain.  She went to urgent care yesterday and received some pain medications to include baclofen, Vicodin, and a prednisone taper and shortly thereafter became quite confused.  She has received 3 units of PRBCs for her  severe anemia with no overt bleeding and hemoglobin has improved to 7.5.  She continues to have low back pain symptoms.  MRI imaging with no sign of CVA noted.  Assessment & Plan:   Principal Problem:   Aphasia Active Problems:   GERD (gastroesophageal reflux disease)   Symptomatic anemia   Asymptomatic bacteriuria   Class 1 obesity   Compression fracture of T11 vertebra (HCC)   Acute encephalopathy likely toxic--improving -MRI studies negative for CVA -Mentation appears to be improving as she remains off of baclofen and narcotics as well as steroids -No need for neurology consultation needed  Severe acute blood loss anemia-improved -Possibly related to GI bleed with recent NSAID use -Hemoglobin improved to 7.5 after 3 unit PRBC transfusion -Recheck CBC in a.m. -Maintain on PPI IV as ordered -No overt bleeding otherwise noted -Appreciate GI plans for endoscopy in a.m. with n.p.o. after midnight  Lumbago secondary to T11 compression fracture -Minimal pain medications as needed, otherwise bedrest -Plan for possible vertebroplasty after stabilization achieved  GERD -Continue PPI IV twice daily as ordered  Obesity -Lifestyle changes outpatient   DVT prophylaxis:SCDs Code Status: Full Family Communication: Husband at bedside Disposition Plan:  Status is: Observation  The patient will require care spanning > 2 midnights and should be moved to inpatient because: Ongoing diagnostic testing needed not appropriate for outpatient work up, IV treatments appropriate due to intensity of illness or inability to take PO and Inpatient level of care appropriate due to severity of illness  Dispo: The patient is from: Home              Anticipated d/c is to: Home              Anticipated d/c date is: 2 days              Patient currently is not medically stable to d/c.   Consultants:   GI  Procedures:   See below  Antimicrobials:   None   Subjective: Patient seen and  evaluated today with ongoing mid back pain with any movement.  She denies any nausea, vomiting, abdominal pain, or bowel movements.  Objective: Vitals:   08/22/20 0600 08/22/20 0630 08/22/20 0700 08/22/20 0730  BP: 136/72 122/66 131/74 (!) 113/59  Pulse: 78 65 75 61  Resp: 17 (!) 21 (!) 21 19  Temp:      TempSrc:      SpO2: 95% 95% 99% 97%  Weight:      Height:        Intake/Output Summary (Last 24 hours) at 08/22/2020 0740 Last data filed at 08/22/2020 0241 Gross per 24 hour  Intake 632 ml  Output --  Net 632 ml   Filed Weights   08/21/20 2104  Weight: 78.5 kg    Examination:  General exam: Appears calm and comfortable, pleasantly confused Respiratory system: Clear to auscultation. Respiratory effort normal. Cardiovascular system: S1 & S2 heard, RRR.  Gastrointestinal system: Abdomen is nondistended, soft and nontender.  Central nervous system: Alert and awake Extremities: Symmetric 5 x 5 power. Skin: No rashes, lesions or ulcers Psychiatry: Judgement and insight appear normal. Mood & affect appropriate.     Data Reviewed: I have personally reviewed following labs and imaging studies  CBC: Recent Labs  Lab 08/21/20 2135 08/21/20 2232 08/22/20 0525  WBC 7.9 8.0 9.2  NEUTROABS 7.0 6.9  --   HGB 3.7* 3.7* 7.5*  HCT 15.3* 16.1* 26.8*  MCV 72.2* 74.2* 80.5  PLT 363 PLATELET CLUMPS NOTED ON SMEAR, UNABLE TO ESTIMATE 339   Basic Metabolic Panel: Recent Labs  Lab 08/21/20 2135  NA 138  K 3.5  CL 108  CO2 19*  GLUCOSE 139*  BUN 17  CREATININE 0.59  CALCIUM 8.8*   GFR: Estimated Creatinine Clearance: 78 mL/min (by C-G formula based on SCr of 0.59 mg/dL). Liver Function Tests: Recent Labs  Lab 08/21/20 2135  AST 19  ALT 26  ALKPHOS 60  BILITOT 0.7  PROT 7.4  ALBUMIN 3.6   No results for input(s): LIPASE, AMYLASE in the last 168 hours. No results for input(s): AMMONIA in the last 168 hours. Coagulation Profile: Recent Labs  Lab 08/21/20 2135   INR 1.1   Cardiac Enzymes: No results for input(s): CKTOTAL, CKMB, CKMBINDEX, TROPONINI in the last 168 hours. BNP (last 3 results) No results for input(s): PROBNP in the last 8760 hours. HbA1C: No results for input(s): HGBA1C in the last 72 hours. CBG: Recent Labs  Lab 08/21/20 2140  GLUCAP 139*   Lipid Profile: Recent Labs    08/22/20 0525  CHOL 121  HDL 29*  LDLCALC 70  TRIG 825  CHOLHDL 4.2   Thyroid Function Tests: No results for input(s): TSH, T4TOTAL, FREET4, T3FREE, THYROIDAB in the last 72 hours. Anemia Panel: No results for input(s): VITAMINB12, FOLATE, FERRITIN, TIBC, IRON, RETICCTPCT in the last 72 hours. Sepsis Labs: No results for input(s): PROCALCITON, LATICACIDVEN in the last 168 hours.  Recent Results (from the past 240 hour(s))  SARS  Coronavirus 2 by RT PCR (hospital order, performed in [facility] lab) Nasopharyngeal Nasopharyngeal Swab     Status: None   Collection Time: 08/21/20  9:45 PM   Specimen: Nasopharyngeal Swab  Result Value Ref Range Status   SARS Coronavirus 2 NEGATIVE NEGATIVE Final    Comment: (NOTE) SARS-CoV-2 target nucleic acids are NOT DETECTED.  The SARS-CoV-2 RNA is generally detectable in upper and lower respiratory specimens during the acute phase of infection. The lowest concentration of SARS-CoV-2 viral copies this assay can detect is 250 copies / mL. A negative result does not preclude SARS-CoV-2 infection and should not be used as the sole basis for treatment or other patient management decisions.  A negative result may occur with improper specimen collection / handling, submission of specimen other than nasopharyngeal swab, presence of viral mutation(s) within the areas targeted by this assay, and inadequate number of viral copies (<250 copies / mL). A negative result must be combined with clinical observations, patient history, and epidemiological information.  Fact Sheet for Patients:    BoilerBrush.com.cyhttps://www.fda.gov/media/136312/download  Fact Sheet for Healthcare Providers: https://pope.com/https://www.fda.gov/media/136313/download  This test is not yet approved or  cleared by the [place] FDA and has been authorized for detection and/or diagnosis of SARS-CoV-2 by FDA under an Emergency Use Authorization (EUA).  This EUA will remain in effect (meaning this test can be used) for the duration of the COVID-19 declaration under Section 564(b)(1) of the Act, 21 U.S.C. section 360bbb-3(b)(1), unless the authorization is terminated or revoked sooner.  Performed at [facility], [address] [place], [place] [address]          Radiology Studies: CT ABDOMEN PELVIS WO CONTRAST  Result Date: 08/21/2020 CLINICAL DATA:  Abdominal trauma, code stroke. Fell 3 days ago. Patient brought in to ED confused. EXAM: CT CHEST, ABDOMEN AND PELVIS WITHOUT CONTRAST TECHNIQUE: Multidetector CT imaging of the chest, abdomen and pelvis was performed following the standard protocol without IV contrast. COMPARISON:  X-ray abdomen 08/10/2017, chest x-ray 08/12/2017. FINDINGS: CT CHEST FINDINGS Cardiovascular: No significant vascular findings. Normal heart size. No pericardial effusion. Hyperdense myocardium compared to the cardiac chambers suggestive of anemia. The thoracic aorta is normal in caliber. Mild atherosclerotic plaque. No surrounding fat stranding of the aorta. Mediastinum/Nodes: No enlarged mediastinal, hilar, or axillary lymph nodes. Thyroid gland, trachea, and esophagus demonstrate no significant findings. Moderate volume hiatal hernia. No bowel wall thickening or pneumatosis of the stomach. Lungs/Pleura: Expriatory phase of respiration. Few scattered pulmonary micro nodules. No pulmonary mass. No focal consolidation. No pleural effusion or pneumothorax. Musculoskeletal: No chest wall abnormality No suspicious lytic or blastic osseous lesions. Multiple, old healed, nondisplaced rib fractures bilaterally. No  acute displaced rib fracture. No acute displaced sternal fracture. Multilevel degenerative changes of the spine. Age-indeterminate compression fracture of the T11 vertebral body with greater than 40% height loss. CT ABDOMEN PELVIS FINDINGS Hepatobiliary: No focal liver abnormality is seen. Status post cholecystectomy. No biliary dilatation. Pancreas: Unremarkable. No pancreatic ductal dilatation or surrounding inflammatory changes. Spleen: Normal in size without focal abnormality. Adrenals/Urinary Tract: No adrenal nodule bilaterally. No nephrolithiasis, no hydronephrosis, and no contour-deforming renal mass. No ureterolithiasis or hydroureter. The urinary bladder is unremarkable. Stomach/Bowel: Stomach is within normal limits. Appendix appears normal in caliber with and a punctate appendicolith at its tip. No evidence of bowel wall thickening, distention, or inflammatory changes. The cecum is noted to be medialized. Vascular/Lymphatic: No significant vascular findings are present. No fat stranding surrounding the aorta. Mild atherosclerotic plaque. No  enlarged abdominal or pelvic lymph nodes. Reproductive: Uterus and bilateral adnexa are unremarkable. Other: No abdominal wall hernia or abnormality. No abdominopelvic ascites. Musculoskeletal: No abdominal wall hernia or abnormality. Fatty degeneration of the left tensor fascia lata muscle. Sclerotic lesion with rings and arcs morphology of the right S1 level (7:105, 3:89) as well as a nodular lesion that a similar within the left iliac bone (7:101). These lesions were previously identified on x-ray abdomen 08/10/2017 and therefore may represent bone island versus enchondromas. Otherwise no suspicious or new lytic or blastic osseous lesions. Multilevel degenerative changes of the spine. IMPRESSION: 1. No acute intrathoracic or intra-abdominal/intrapelvic traumatic injury on this noncontrast study. 2. Age-indeterminate, possibly acute, T11 vertebral body fracture with  greater than 40% height loss. Correlate with tenderness to palpation. 3. Moderate-sized hiatal hernia. 4.  Aortic Atherosclerosis (ICD10-I70.0). Electronically Signed   By: Tish Frederickson M.D.   On: 08/21/2020 22:04   CT Chest Wo Contrast  Result Date: 08/21/2020 CLINICAL DATA:  Abdominal trauma, code stroke. Fell 3 days ago. Patient brought in to ED confused. EXAM: CT CHEST, ABDOMEN AND PELVIS WITHOUT CONTRAST TECHNIQUE: Multidetector CT imaging of the chest, abdomen and pelvis was performed following the standard protocol without IV contrast. COMPARISON:  X-ray abdomen 08/10/2017, chest x-ray 08/12/2017. FINDINGS: CT CHEST FINDINGS Cardiovascular: No significant vascular findings. Normal heart size. No pericardial effusion. Hyperdense myocardium compared to the cardiac chambers suggestive of anemia. The thoracic aorta is normal in caliber. Mild atherosclerotic plaque. No surrounding fat stranding of the aorta. Mediastinum/Nodes: No enlarged mediastinal, hilar, or axillary lymph nodes. Thyroid gland, trachea, and esophagus demonstrate no significant findings. Moderate volume hiatal hernia. No bowel wall thickening or pneumatosis of the stomach. Lungs/Pleura: Expriatory phase of respiration. Few scattered pulmonary micro nodules. No pulmonary mass. No focal consolidation. No pleural effusion or pneumothorax. Musculoskeletal: No chest wall abnormality No suspicious lytic or blastic osseous lesions. Multiple, old healed, nondisplaced rib fractures bilaterally. No acute displaced rib fracture. No acute displaced sternal fracture. Multilevel degenerative changes of the spine. Age-indeterminate compression fracture of the T11 vertebral body with greater than 40% height loss. CT ABDOMEN PELVIS FINDINGS Hepatobiliary: No focal liver abnormality is seen. Status post cholecystectomy. No biliary dilatation. Pancreas: Unremarkable. No pancreatic ductal dilatation or surrounding inflammatory changes. Spleen: Normal in size  without focal abnormality. Adrenals/Urinary Tract: No adrenal nodule bilaterally. No nephrolithiasis, no hydronephrosis, and no contour-deforming renal mass. No ureterolithiasis or hydroureter. The urinary bladder is unremarkable. Stomach/Bowel: Stomach is within normal limits. Appendix appears normal in caliber with and a punctate appendicolith at its tip. No evidence of bowel wall thickening, distention, or inflammatory changes. The cecum is noted to be medialized. Vascular/Lymphatic: No significant vascular findings are present. No fat stranding surrounding the aorta. Mild atherosclerotic plaque. No enlarged abdominal or pelvic lymph nodes. Reproductive: Uterus and bilateral adnexa are unremarkable. Other: No abdominal wall hernia or abnormality. No abdominopelvic ascites. Musculoskeletal: No abdominal wall hernia or abnormality. Fatty degeneration of the left tensor fascia lata muscle. Sclerotic lesion with rings and arcs morphology of the right S1 level (7:105, 3:89) as well as a nodular lesion that a similar within the left iliac bone (7:101). These lesions were previously identified on x-ray abdomen 08/10/2017 and therefore may represent bone island versus enchondromas. Otherwise no suspicious or new lytic or blastic osseous lesions. Multilevel degenerative changes of the spine. IMPRESSION: 1. No acute intrathoracic or intra-abdominal/intrapelvic traumatic injury on this noncontrast study. 2. Age-indeterminate, possibly acute, T11 vertebral body fracture with greater than 40%  height loss. Correlate with tenderness to palpation. 3. Moderate-sized hiatal hernia. 4.  Aortic Atherosclerosis (ICD10-I70.0). Electronically Signed   By: Morgane  Naveau M.D.   OTish Frederickson1 22:04   CT HEAD CODE STROKE WO CONTRAST  Result Date: 08/21/2020 CLINICAL DATA:  Code stroke. Initial evaluation for acute altered mental status, confusion. EXAM: CT HEAD WITHOUT CONTRAST TECHNIQUE: Contiguous axial images were obtained from  the base of the skull through the vertex without intravenous contrast. COMPARISON:  None available. FINDINGS: Brain: Age-related cerebral atrophy with mild chronic small vessel ischemic disease. Scattered areas of multifocal encephalomalacia involving the anterior left frontotemporal region most likely related to remote trauma. No acute intracranial hemorrhage. No acute large vessel territory infarct. No mass lesion, mass effect, or midline shift. No hydrocephalus or extra-axial fluid collection. Vascular: No hyperdense vessel. Skull: Scalp soft tissues within normal limits. Calvarium intact. Postsurgical changes partially visualize within the upper cervical spine. Sinuses/Orbits: Globes and orbital soft tissues within normal limits. Paranasal sinuses are clear. No mastoid effusion. Other: None. ASPECTS [facility] Stroke Program Early CT Score) - Ganglionic level infarction (caudate, lentiform nuclei, internal capsule, insula, M1-M3 cortex): 7 - Supraganglionic infarction (M4-M6 cortex): 3 Total score (0-10 with 10 being normal): 10 IMPRESSION: 1. No acute intracranial infarct or other abnormality. 2. ASPECTS is 10. 3. Chronic left frontotemporal encephalomalacia, most likely related to remote trauma. 4. Underlying age-related cerebral atrophy with mild chronic small vessel ischemic disease. Critical Value/emergent results were called by telephone at the time of interpretation on 08/21/2020 at 9:37 pm to provider JOSEPH ZAMMIT , who verbally acknowledged these results. Electronically Signed   By: Rise Mu M.D.   On: 08/21/2020 21:39        Scheduled Meds: .  stroke: mapping our early stages of recovery book   Does not apply Once  . pantoprazole (PROTONIX) IV  40 mg Intravenous Q12H    LOS: 0 days    Time spent: 35 minutes    Mahki Spikes Hoover Brunette, DO [facility]  If 7PM-7AM, please contact night-coverage [email or web address] 08/22/2020, 7:40 AM

## 2020-08-22 NOTE — Evaluation (Signed)
Physical Therapy Evaluation Patient Details Name: Kylie Wells MRN: [id number] DOB: December 13, 1965 Today's Date: 08/22/2020   History of Present Illness  Kylie Wells is a 54 y.o. female with medical history significant of GERD, TBI in 2018 who was brought to the emergency department via EMS due to a possible occult stroke.  Her husband stated that he last saw her normal around 1430 earlier today, she took hydrocodone and a muscle relaxant earlier and went to sleep.  When she woke up about 4 hours later, she was very confused.  EMS stated that she was altered on their initial assessment.  The patient has been taking analgesics and muscle relaxants after having a fall over the weekend and diagnosed with musculoskeletal pain on her back.  Her husband states that she has been taking a lot of ibuprofen until recently due to chronic left foot pain.  She was scheduled to have surgery on her left foot to try to correct this issue, but this was postponed due to the COVID-19 delta variant healthcare crisis.  They deny vaginal bleed, melena or hematemesis.  The husband stated that she has been sleeping more than usual.  He also noticed that her systolic BP was in the low 100s when she was at the urgent care, which is unusual for her.  No fever, chills, headache, sore throat, rhinorrhea, wheezing or hemoptysis.  No chest pain, palpitations, diaphoresis, PND, orthopnea or recent pitting edema of the lower extremities.  No abdominal pain, diarrhea, constipation, melena or hematochezia.  No dysuria, frequency or hematuria.  Denies polyuria, polydipsia, polyphagia or blurred vision.    Clinical Impression  Patient has difficulty sitting up at bedside due to low back pain and demonstrates fair/good return for log rolling to side after instruction, unsteady on feet with tendency to lean on nearby objects for support without AD, required use of RW for safety and tolerated ambulating in room/hallway without loss of balance.   Patient put back to bed with her spouse present in room after therapy.  Patient will benefit from continued physical therapy in hospital and recommended venue below to increase strength, balance, endurance for safe ADLs and gait.    Follow Up Recommendations Home health PT;Supervision - Intermittent;Supervision for mobility/OOB    Equipment Recommendations  None recommended by PT    Recommendations for Other Services       Precautions / Restrictions Precautions Precautions: Fall Restrictions Weight Bearing Restrictions: No      Mobility  Bed Mobility Overal bed mobility: Needs Assistance Bed Mobility: Supine to Sit;Sit to Supine     Supine to sit: Min assist Sit to supine: Min assist   General bed mobility comments: slow labored movement with fair/good return for log rolling after instruction  Transfers Overall transfer level: Needs assistance Equipment used: Rolling walker (2 wheeled);1 person hand held assist Transfers: Sit to/from [employer] Sit to Stand: Min guard Stand pivot transfers: Min guard;Min assist       General transfer comment: increased time, labored movement  Ambulation/Gait Ambulation/Gait assistance: Min guard;Min assist Gait Distance (Feet): 75 Feet Assistive device: Rolling walker (2 wheeled) Gait Pattern/deviations: Decreased step length - right;Decreased step length - left;Decreased stride length Gait velocity: decreased   General Gait Details: slightly labored cadence without loss of balance, limited secondary to fatigue  Stairs            Wheelchair Mobility    Modified Rankin (Stroke Patients Only)       Balance Overall balance assessment: Needs  assistance Sitting-balance support: No upper extremity supported;Feet supported Sitting balance-Leahy Scale: Good Sitting balance - Comments: seated at EOB   Standing balance support: No upper extremity supported;During functional activity Standing balance-Leahy  Scale: Poor Standing balance comment: fair/poor without AD, fair/good using RW                             Pertinent Vitals/Pain Pain Assessment: Faces Faces Pain Scale: Hurts little more Pain Location: low back Pain Descriptors / Indicators: Aching;Sore Pain Intervention(s): Limited activity within patient's tolerance;Monitored during session;Premedicated before session;Repositioned    Home Living Family/patient expects to be discharged to:: Private residence Living Arrangements: Spouse/significant other Available Help at Discharge: Family;Friend(s);Available 24 hours/day Type of Home: House Home Access: Stairs to enter Entrance Stairs-Rails: Right;Left;Can reach both Entrance Stairs-Number of Steps: 3 Home Layout: One level Home Equipment: Walker - 4 wheels;Cane - single point;Bedside commode;Shower seat      Prior Function Level of Independence: Needs assistance   Gait / Transfers Assistance Needed: household ambulator without AD  ADL's / Homemaking Assistance Needed: assisted by spouse        Hand Dominance        Extremity/Trunk Assessment   Upper Extremity Assessment Upper Extremity Assessment: Overall WFL for tasks assessed    Lower Extremity Assessment Lower Extremity Assessment: Generalized weakness    Cervical / Trunk Assessment Cervical / Trunk Assessment: Normal  Communication   Communication: No difficulties  Cognition Arousal/Alertness: Awake/alert Behavior During Therapy: WFL for tasks assessed/performed Overall Cognitive Status: Within Functional Limits for tasks assessed                                        General Comments      Exercises     Assessment/Plan    PT Assessment Patient needs continued PT services  PT Problem List Decreased strength;Decreased activity tolerance;Decreased balance;Decreased mobility       PT Treatment Interventions DME instruction;Gait training;Stair training;Functional  mobility training;Therapeutic activities;Therapeutic exercise;Balance training;Patient/family education    PT Goals (Current goals can be found in the Care Plan section)  Acute Rehab PT Goals Patient Stated Goal: return home with family to assist PT Goal Formulation: With patient/family Time For Goal Achievement: 08/29/20 Potential to Achieve Goals: Good    Frequency Min 3X/week   Barriers to discharge        Co-evaluation               AM-PAC PT "6 Clicks" Mobility  Outcome Measure Help needed turning from your back to your side while in a flat bed without using bedrails?: A Little Help needed moving from lying on your back to sitting on the side of a flat bed without using bedrails?: A Little Help needed moving to and from a bed to a chair (including a wheelchair)?: A Little Help needed standing up from a chair using your arms (e.g., wheelchair or bedside chair)?: A Little Help needed to walk in hospital room?: A Little Help needed climbing 3-5 steps with a railing? : A Lot 6 Click Score: 17    End of Session   Activity Tolerance: Patient tolerated treatment well;Patient limited by fatigue Patient left: in bed;with call bell/phone within reach;with family/visitor present Nurse Communication: Mobility status PT Visit Diagnosis: Unsteadiness on feet (R26.81);Other abnormalities of gait and mobility (R26.89);Muscle weakness (generalized) (M62.81)    Time:  6812-7517 PT Time Calculation (min) (ACUTE ONLY): 30 min   Charges:   PT Evaluation $PT Eval Moderate Complexity: 1 Mod PT Treatments $Therapeutic Activity: 23-37 mins        2:13 PM, 08/22/20 Ocie Bob, MPT Physical Therapist with [facility] [phone] office [phone] mobile phone

## 2020-08-22 NOTE — Plan of Care (Signed)
  Problem: Acute Rehab PT Goals(only PT should resolve) Goal: Pt will Roll Supine to Side Outcome: Progressing Flowsheets (Taken 08/22/2020 1415) Pt will Roll Supine to Side: with modified independence Goal: Pt Will Go Supine/Side To Sit Outcome: Progressing Flowsheets (Taken 08/22/2020 1415) Pt will go Supine/Side to Sit: with supervision Goal: Patient Will Transfer Sit To/From Stand Outcome: Progressing Flowsheets (Taken 08/22/2020 1415) Patient will transfer sit to/from stand: with supervision Goal: Pt Will Transfer Bed To Chair/Chair To Bed Outcome: Progressing Flowsheets (Taken 08/22/2020 1415) Pt will Transfer Bed to Chair/Chair to Bed: with supervision Goal: Pt Will Ambulate Outcome: Progressing Flowsheets (Taken 08/22/2020 1415) Pt will Ambulate:  100 feet  with supervision  with rolling walker   2:16 PM, 08/22/20 Ocie Bob, MPT Physical Therapist with [facility] [phone] office [phone] mobile phone

## 2020-08-22 NOTE — ED Notes (Signed)
Ultrasound in pt room 

## 2020-08-22 NOTE — ED Notes (Signed)
Per husband, at bedside, pt is normally aphasic at baseline. Pt was given prescribed medication for back muscle injury around 2pm. At 230 pt fell and hit her back and was confused and more aphasic than usual. Currently husband states that she seems a little bit more oriented than earlier. Hospitalist made aware that husband is in the room if collateral information is needed.

## 2020-08-22 NOTE — Care Management Obs Status (Signed)
MEDICARE OBSERVATION STATUS NOTIFICATION   Patient Details  Name: Kylie Wells MRN: [id number] Date of Birth: [birth year]   Medicare Observation Status Notification Given:  Yes    Corey Harold 08/22/2020, 2:40 PM

## 2020-08-22 NOTE — ED Notes (Signed)
Pt back from MRI 

## 2020-08-22 NOTE — Progress Notes (Signed)
Spoke to husband, Bruce. Provided updated med list.   Statin 5mg  daily ASA 81mg  daily MVI daily  Fish oil Lexapro daily Ibuprofen prn (regularly) Urgent care just started her on medrol dose pack, baclofen (took 1), vicodin (took 3 total)  Tentatively plan for EGD tomorrow.

## 2020-08-22 NOTE — Progress Notes (Signed)
Code stroke  Call time  2038  EMS in [place]   2115 Exam start  2115 Exam finish  2119 [facility]   2119 Epic   2119 Rad   2119

## 2020-08-23 ENCOUNTER — Encounter (HOSPITAL_COMMUNITY)
Admission: EM | Disposition: A | Discharge status: Home / Self Care | Payer: Self-pay | Source: Home / Self Care | Attending: Internal Medicine

## 2020-08-23 ENCOUNTER — Inpatient Hospital Stay (HOSPITAL_COMMUNITY): Payer: Medicare HMO | Admitting: Anesthesiology

## 2020-08-23 ENCOUNTER — Telehealth: Payer: Self-pay | Admitting: Nurse Practitioner

## 2020-08-23 ENCOUNTER — Encounter (HOSPITAL_COMMUNITY): Payer: Self-pay | Admitting: Internal Medicine

## 2020-08-23 HISTORY — PX: BIOPSY: SHX5522

## 2020-08-23 HISTORY — PX: ESOPHAGOGASTRODUODENOSCOPY (EGD) WITH PROPOFOL: SHX5813

## 2020-08-23 LAB — TYPE AND SCREEN
ABO/RH(D): O POS
Antibody Screen: NEGATIVE
Unit division: 0
Unit division: 0
Unit division: 0

## 2020-08-23 LAB — BPAM RBC
Blood Product Expiration Date: 202110242359
Blood Product Expiration Date: 202110242359
Blood Product Expiration Date: 202110242359
ISSUE DATE / TIME: 202109222309
ISSUE DATE / TIME: 202109230054
ISSUE DATE / TIME: 202109230248
Unit Type and Rh: 5100
Unit Type and Rh: 5100
Unit Type and Rh: 5100

## 2020-08-23 LAB — BASIC METABOLIC PANEL
Anion gap: 9 (ref 5–15)
BUN: 11 mg/dL (ref 6–20)
CO2: 22 mmol/L (ref 22–32)
Calcium: 8.4 mg/dL — ABNORMAL LOW (ref 8.9–10.3)
Chloride: 108 mmol/L (ref 98–111)
Creatinine, Ser: 0.5 mg/dL (ref 0.44–1.00)
GFR calc Af Amer: >60 mL/min (ref >60)
GFR calc non Af Amer: >60 mL/min (ref >60)
Glucose, Bld: 91 mg/dL (ref 70–99)
Potassium: 3.6 mmol/L (ref 3.5–5.1)
Sodium: 139 mmol/L (ref 135–145)

## 2020-08-23 LAB — CBC
HCT: 25.9 % — ABNORMAL LOW (ref 36.0–46.0)
Hemoglobin: 7.3 g/dL — ABNORMAL LOW (ref 12.0–15.0)
MCH: 22.8 pg — ABNORMAL LOW (ref 26.0–34.0)
MCHC: 28.2 g/dL — ABNORMAL LOW (ref 30.0–36.0)
MCV: 80.9 fL (ref 80.0–100.0)
Platelets: 326 10*3/uL (ref 150–400)
RBC: 3.2 MIL/uL — ABNORMAL LOW (ref 3.87–5.11)
RDW: 22.1 % — ABNORMAL HIGH (ref 11.5–15.5)
WBC: 6.1 10*3/uL (ref 4.0–10.5)
nRBC: 0.3 % — ABNORMAL HIGH (ref 0.0–0.2)

## 2020-08-23 LAB — MAGNESIUM: Magnesium: 1.9 mg/dL (ref 1.7–2.4)

## 2020-08-23 SURGERY — ESOPHAGOGASTRODUODENOSCOPY (EGD) WITH PROPOFOL
Anesthesia: General

## 2020-08-23 MED ORDER — LACTATED RINGERS IV SOLN: Freq: Once | INTRAVENOUS | Status: AC

## 2020-08-23 MED ORDER — PANTOPRAZOLE SODIUM 40 MG PO TBEC
40.0000 mg | DELAYED_RELEASE_TABLET | Freq: Every day | ORAL | Status: DC
  Administered 2020-08-24 – 2020-08-28 (×5): 40 mg via ORAL
  Filled 2020-08-23 (×5): qty 1

## 2020-08-23 MED ORDER — PROPOFOL 500 MG/50ML IV EMUL
INTRAVENOUS | Status: DC | PRN
  Administered 2020-08-23 (×2): 125 ug/kg/min via INTRAVENOUS

## 2020-08-23 MED ORDER — SODIUM CHLORIDE 0.9 % IV SOLN: INTRAVENOUS | Status: DC

## 2020-08-23 MED ORDER — ACETAMINOPHEN 325 MG PO TABS
650.0000 mg | ORAL_TABLET | Freq: Four times a day (QID) | ORAL | Status: DC
  Administered 2020-08-23 – 2020-08-28 (×19): 650 mg via ORAL
  Filled 2020-08-23 (×19): qty 2

## 2020-08-23 MED ORDER — LIDOCAINE 5 % EX PTCH
1.0000 | MEDICATED_PATCH | CUTANEOUS | Status: DC
  Administered 2020-08-23 – 2020-08-27 (×5): 1 via TRANSDERMAL
  Filled 2020-08-23 (×5): qty 1

## 2020-08-23 MED ORDER — PROPOFOL 10 MG/ML IV BOLUS
INTRAVENOUS | Status: DC | PRN
  Administered 2020-08-23: 100 mg via INTRAVENOUS
  Administered 2020-08-23: 20 mg via INTRAVENOUS

## 2020-08-23 NOTE — Telephone Encounter (Signed)
Error

## 2020-08-23 NOTE — Care Management Important Message (Signed)
Important Message  Patient Details  Name: Kylie Wells MRN: [id number] Date of Birth: 20-Jul-1966   Medicare Important Message Given:  Yes     Corey Harold 08/23/2020, 3:26 PM

## 2020-08-23 NOTE — Anesthesia Procedure Notes (Signed)
Date/Time: 08/23/2020 2:11 PM Performed by: Franco Nones, CRNA Pre-anesthesia Checklist: Patient identified, Emergency Drugs available, Suction available, Timeout performed and Patient being monitored Patient Re-evaluated:Patient Re-evaluated prior to induction Oxygen Delivery Method: Nasal Cannula

## 2020-08-23 NOTE — Anesthesia Preprocedure Evaluation (Addendum)
Anesthesia Evaluation  Patient identified by MRN, date of birth, ID band Patient awake    Reviewed: Allergy & Precautions, NPO status , Patient's Chart, lab work & pertinent test results  History of Anesthesia Complications Negative for: history of anesthetic complications  Airway Mallampati: II  TM Distance: >3 FB Neck ROM: Full   Comment: Neck sx Dental  (+) Teeth Intact, Dental Advisory Given   Pulmonary shortness of breath and with exertion,    Pulmonary exam normal breath sounds clear to auscultation       Cardiovascular Exercise Tolerance: Good Normal cardiovascular exam Rhythm:Regular Rate:Normal  1. Left ventricular ejection fraction, by estimation, is 60 to 65%. The  left ventricle has normal function. The left ventricle has no regional  wall motion abnormalities. There is mild left ventricular hypertrophy.  Left ventricular diastolic parameters  were normal.  2. Right ventricular systolic function is normal. The right ventricular  size is normal.  3. The mitral valve is normal in structure. Trivial mitral valve  regurgitation. No evidence of mitral stenosis.  4. The aortic valve is tricuspid. Aortic valve regurgitation is not  visualized. No aortic stenosis is present.  5. The inferior vena cava is normal in size with greater than 50%  respiratory variability, suggesting right atrial pressure of 3 mmHg.    Neuro/Psych Altered level of consciousness and confusion when she came to the hospital, hb 3.7, was transfused 3 units of blood.  negative psych ROS   GI/Hepatic Neg liver ROS, GERD  Medicated and Controlled,  Endo/Other    Renal/GU negative Renal ROS  negative genitourinary   Musculoskeletal Neck sx   Abdominal   Peds  Hematology  (+) anemia ,   Anesthesia Other Findings   Reproductive/Obstetrics                             Anesthesia Physical Anesthesia Plan  ASA:  IV  Anesthesia Plan: General   Post-op Pain Management:    Induction: Intravenous  PONV Risk Score and Plan: TIVA  Airway Management Planned: Nasal Cannula and Natural Airway  Additional Equipment:   Intra-op Plan:   Post-operative Plan:   Informed Consent: I have reviewed the patients History and Physical, chart, labs and discussed the procedure including the risks, benefits and alternatives for the proposed anesthesia with the patient or authorized representative who has indicated his/her understanding and acceptance.     Dental advisory given  Plan Discussed with: CRNA and Surgeon  Anesthesia Plan Comments:         Anesthesia Quick Evaluation

## 2020-08-23 NOTE — Progress Notes (Signed)
PROGRESS NOTE    Kylie Wells  TSV:[id number] DOB: 04-27-1966 DOA: 08/21/2020 PCP: Pcp, No   Brief Narrative:  Per HPI: Kylie Cooperis a 54 y.o.femalewith medical history significant ofGERD, TBI in 2018 who was brought to the emergency department via EMS due to a possible occult stroke. Her husband stated that he last saw her normal around 1430 earlier today, she took hydrocodone and a muscle relaxant earlier and went to sleep. When she woke up about 4 hours later, she was very confused. EMS stated that she was altered on their initial assessment. The patient has been taking analgesics and muscle relaxants after having a fall over the weekend and diagnosed with musculoskeletal pain on her back. Her husband states that she has been taking a lot of ibuprofen until recently due to chronic left foot pain. She was scheduled to have surgery on her left foot to try to correct this issue, but this was postponed due to the COVID-19 delta varianthealthcare crisis. They deny vaginal bleed, melena or hematemesis. The husband stated that she has been sleeping more than usual. He also noticed that her systolic BP was in the low 100swhen she was at the urgent care, which is unusual for her. No fever, chills, headache, sore throat, rhinorrhea, wheezing or hemoptysis. No chest pain, palpitations, diaphoresis, PND, orthopnea or recent pitting edema of the lower extremities. No abdominal pain, diarrhea, constipation, melena or hematochezia. No dysuria, frequency or hematuria. Denies polyuria, polydipsia, polyphagia or blurred vision.  9/23: According to husband at bedside, patient had a fall several days ago on Saturday of last week after which point in time she had excruciating low back pain.  She went to urgent care yesterday and received some pain medications to include baclofen, Vicodin, and a prednisone taper and shortly thereafter became quite confused.  She has received 3 units of PRBCs for her  severe anemia with no overt bleeding and hemoglobin has improved to 7.5.  She continues to have low back pain symptoms.  MRI imaging with no sign of CVA noted.  9/24: Hemoglobin remained stable this morning and confusion is improving.  Plans for EGD this morning per GI.  Assessment & Plan:   Principal Problem:   Aphasia Active Problems:   GERD (gastroesophageal reflux disease)   Microcytic anemia   Asymptomatic bacteriuria   Class 1 obesity   Compression fracture of T11 vertebra (HCC)   Acute blood loss anemia   Acute encephalopathy likely toxic--improving -MRI studies negative for CVA -Mentation appears to be improving as she remains off of baclofen and narcotics as well as steroids -No need for neurology consultation needed -2D echocardiogram with no acute findings  Severe acute blood loss anemia-stable -Possibly related to GI bleed with recent NSAID use -Hemoglobin improved to 7.5 after 3 unit PRBC transfusion and remains at 7.3 -Recheck CBC in a.m. -Maintain on PPI IV as ordered -No overt bleeding otherwise noted -Appreciate GI plans for endoscopy in a.m. with n.p.o. after midnight  Lumbago secondary to T11 compression fracture -Minimal pain medications as needed, otherwise bedrest -Plan for possible vertebroplasty after stabilization achieved  GERD -Continue PPI IV twice daily as ordered  Obesity -Lifestyle changes outpatient   DVT prophylaxis:SCDs Code Status: Full Family Communication: Husband at bedside Disposition Plan:  Status is: Inpatient  Remains inpatient appropriate because:IV treatments appropriate due to intensity of illness or inability to take PO and Inpatient level of care appropriate due to severity of illness   Dispo: The patient is from: Home  Anticipated d/c is to: Home              Anticipated d/c date is: 2 days              Patient currently is not medically stable to d/c.   Consultants:   GI  Procedures:    See below  Antimicrobials:   None  Subjective: Patient seen and evaluated today with no new acute complaints or concerns. No acute concerns or events noted overnight.  She denies any overt bleeding appears less confused.  Objective: Vitals:   08/23/20 0239 08/23/20 0440 08/23/20 0956 08/23/20 1302  BP: 112/67 108/62 (!) 144/70 (!) 139/59  Pulse: 64 61 71 68  Resp: 20 16  20   Temp: 98.3 F (36.8 C) 98.2 F (36.8 C) 98.3 F (36.8 C) 98.9 F (37.2 C)  TempSrc: Oral Oral Oral Oral  SpO2: 95%  98% 96%  Weight:    81.1 kg  Height:    5\' 2"  (1.575 m)    Intake/Output Summary (Last 24 hours) at 08/23/2020 1419 Last data filed at 08/23/2020 0600 Gross per 24 hour  Intake 1312.96 ml  Output --  Net 1312.96 ml   Filed Weights   08/21/20 2104 08/22/20 2020 08/23/20 1302  Weight: 78.5 kg 81.1 kg 81.1 kg    Examination:  General exam: Appears calm and comfortable  Respiratory system: Clear to auscultation. Respiratory effort normal. Cardiovascular system: S1 & S2 heard, RRR.  Gastrointestinal system: Abdomen is nondistended, soft and nontender. Central nervous system: Alert and oriented. No focal neurological deficits. Extremities: Symmetric 5 x 5 power. Skin: No rashes, lesions or ulcers Psychiatry: Judgement and insight appear normal. Mood & affect appropriate.     Data Reviewed: I have personally reviewed following labs and imaging studies  CBC: Recent Labs  Lab 08/21/20 2135 08/21/20 2232 08/22/20 0525 08/23/20 0705  WBC 7.9 8.0 9.2 6.1  NEUTROABS 7.0 6.9  --   --   HGB 3.7* 3.7* 7.5* 7.3*  HCT 15.3* 16.1* 26.8* 25.9*  MCV 72.2* 74.2* 80.5 80.9  PLT 363 PLATELET CLUMPS NOTED ON SMEAR, UNABLE TO ESTIMATE 339 326   Basic Metabolic Panel: Recent Labs  Lab 08/21/20 2135 08/23/20 0705  NA 138 139  K 3.5 3.6  CL 108 108  CO2 19* 22  GLUCOSE 139* 91  BUN 17 11  CREATININE 0.59 0.50  CALCIUM 8.8* 8.4*  MG  --  1.9   GFR: Estimated Creatinine  Clearance: 81.2 mL/min (by C-G formula based on SCr of 0.5 mg/dL). Liver Function Tests: Recent Labs  Lab 08/21/20 2135  AST 19  ALT 26  ALKPHOS 60  BILITOT 0.7  PROT 7.4  ALBUMIN 3.6   No results for input(s): LIPASE, AMYLASE in the last 168 hours. No results for input(s): AMMONIA in the last 168 hours. Coagulation Profile: Recent Labs  Lab 08/21/20 2135  INR 1.1   Cardiac Enzymes: No results for input(s): CKTOTAL, CKMB, CKMBINDEX, TROPONINI in the last 168 hours. BNP (last 3 results) No results for input(s): PROBNP in the last 8760 hours. HbA1C: Recent Labs    08/21/20 2135  HGBA1C 5.0   CBG: Recent Labs  Lab 08/21/20 2140  GLUCAP 139*   Lipid Profile: Recent Labs    08/22/20 0525  CHOL 121  HDL 29*  LDLCALC 70  TRIG 213110  CHOLHDL 4.2   Thyroid Function Tests: No results for input(s): TSH, T4TOTAL, FREET4, T3FREE, THYROIDAB in the last 72 hours. Anemia Panel: No  results for input(s): VITAMINB12, FOLATE, FERRITIN, TIBC, IRON, RETICCTPCT in the last 72 hours. Sepsis Labs: No results for input(s): PROCALCITON, LATICACIDVEN in the last 168 hours.  Recent Results (from the past 240 hour(s))  SARS Coronavirus 2 by RT PCR (hospital order, performed in [facility] hospital lab) Nasopharyngeal Nasopharyngeal Swab     Status: None   Collection Time: 08/21/20  9:45 PM   Specimen: Nasopharyngeal Swab  Result Value Ref Range Status   SARS Coronavirus 2 NEGATIVE NEGATIVE Final    Comment: (NOTE) SARS-CoV-2 target nucleic acids are NOT DETECTED.  The SARS-CoV-2 RNA is generally detectable in upper and lower respiratory specimens during the acute phase of infection. The lowest concentration of SARS-CoV-2 viral copies this assay can detect is 250 copies / mL. A negative result does not preclude SARS-CoV-2 infection and should not be used as the sole basis for treatment or other patient management decisions.  A negative result may occur with improper specimen  collection / handling, submission of specimen other than nasopharyngeal swab, presence of viral mutation(s) within the areas targeted by this assay, and inadequate number of viral copies (<250 copies / mL). A negative result must be combined with clinical observations, patient history, and epidemiological information.  Fact Sheet for Patients:   BoilerBrush.com.cy  Fact Sheet for Healthcare Providers: https://pope.com/  This test is not yet approved or  cleared by the [place] FDA and has been authorized for detection and/or diagnosis of SARS-CoV-2 by FDA under an Emergency Use Authorization (EUA).  This EUA will remain in effect (meaning this test can be used) for the duration of the COVID-19 declaration under Section 564(b)(1) of the Act, 21 U.S.C. section 360bbb-3(b)(1), unless the authorization is terminated or revoked sooner.  Performed at [facility], [address] [place], [place] [address]          Radiology Studies: CT ABDOMEN PELVIS WO CONTRAST  Result Date: 08/21/2020 CLINICAL DATA:  Abdominal trauma, code stroke. Fell 3 days ago. Patient brought in to ED confused. EXAM: CT CHEST, ABDOMEN AND PELVIS WITHOUT CONTRAST TECHNIQUE: Multidetector CT imaging of the chest, abdomen and pelvis was performed following the standard protocol without IV contrast. COMPARISON:  X-ray abdomen 08/10/2017, chest x-ray 08/12/2017. FINDINGS: CT CHEST FINDINGS Cardiovascular: No significant vascular findings. Normal heart size. No pericardial effusion. Hyperdense myocardium compared to the cardiac chambers suggestive of anemia. The thoracic aorta is normal in caliber. Mild atherosclerotic plaque. No surrounding fat stranding of the aorta. Mediastinum/Nodes: No enlarged mediastinal, hilar, or axillary lymph nodes. Thyroid gland, trachea, and esophagus demonstrate no significant findings. Moderate volume hiatal hernia. No bowel wall thickening  or pneumatosis of the stomach. Lungs/Pleura: Expriatory phase of respiration. Few scattered pulmonary micro nodules. No pulmonary mass. No focal consolidation. No pleural effusion or pneumothorax. Musculoskeletal: No chest wall abnormality No suspicious lytic or blastic osseous lesions. Multiple, old healed, nondisplaced rib fractures bilaterally. No acute displaced rib fracture. No acute displaced sternal fracture. Multilevel degenerative changes of the spine. Age-indeterminate compression fracture of the T11 vertebral body with greater than 40% height loss. CT ABDOMEN PELVIS FINDINGS Hepatobiliary: No focal liver abnormality is seen. Status post cholecystectomy. No biliary dilatation. Pancreas: Unremarkable. No pancreatic ductal dilatation or surrounding inflammatory changes. Spleen: Normal in size without focal abnormality. Adrenals/Urinary Tract: No adrenal nodule bilaterally. No nephrolithiasis, no hydronephrosis, and no contour-deforming renal mass. No ureterolithiasis or hydroureter. The urinary bladder is unremarkable. Stomach/Bowel: Stomach is within normal limits. Appendix appears normal in caliber with and a punctate appendicolith  at its tip. No evidence of bowel wall thickening, distention, or inflammatory changes. The cecum is noted to be medialized. Vascular/Lymphatic: No significant vascular findings are present. No fat stranding surrounding the aorta. Mild atherosclerotic plaque. No enlarged abdominal or pelvic lymph nodes. Reproductive: Uterus and bilateral adnexa are unremarkable. Other: No abdominal wall hernia or abnormality. No abdominopelvic ascites. Musculoskeletal: No abdominal wall hernia or abnormality. Fatty degeneration of the left tensor fascia lata muscle. Sclerotic lesion with rings and arcs morphology of the right S1 level (7:105, 3:89) as well as a nodular lesion that a similar within the left iliac bone (7:101). These lesions were previously identified on x-ray abdomen 08/10/2017  and therefore may represent bone island versus enchondromas. Otherwise no suspicious or new lytic or blastic osseous lesions. Multilevel degenerative changes of the spine. IMPRESSION: 1. No acute intrathoracic or intra-abdominal/intrapelvic traumatic injury on this noncontrast study. 2. Age-indeterminate, possibly acute, T11 vertebral body fracture with greater than 40% height loss. Correlate with tenderness to palpation. 3. Moderate-sized hiatal hernia. 4.  Aortic Atherosclerosis (ICD10-I70.0). Electronically Signed   By: Tish Frederickson M.D.   On: 08/21/2020 22:04   CT Chest Wo Contrast  Result Date: 08/21/2020 CLINICAL DATA:  Abdominal trauma, code stroke. Fell 3 days ago. Patient brought in to ED confused. EXAM: CT CHEST, ABDOMEN AND PELVIS WITHOUT CONTRAST TECHNIQUE: Multidetector CT imaging of the chest, abdomen and pelvis was performed following the standard protocol without IV contrast. COMPARISON:  X-ray abdomen 08/10/2017, chest x-ray 08/12/2017. FINDINGS: CT CHEST FINDINGS Cardiovascular: No significant vascular findings. Normal heart size. No pericardial effusion. Hyperdense myocardium compared to the cardiac chambers suggestive of anemia. The thoracic aorta is normal in caliber. Mild atherosclerotic plaque. No surrounding fat stranding of the aorta. Mediastinum/Nodes: No enlarged mediastinal, hilar, or axillary lymph nodes. Thyroid gland, trachea, and esophagus demonstrate no significant findings. Moderate volume hiatal hernia. No bowel wall thickening or pneumatosis of the stomach. Lungs/Pleura: Expriatory phase of respiration. Few scattered pulmonary micro nodules. No pulmonary mass. No focal consolidation. No pleural effusion or pneumothorax. Musculoskeletal: No chest wall abnormality No suspicious lytic or blastic osseous lesions. Multiple, old healed, nondisplaced rib fractures bilaterally. No acute displaced rib fracture. No acute displaced sternal fracture. Multilevel degenerative changes of  the spine. Age-indeterminate compression fracture of the T11 vertebral body with greater than 40% height loss. CT ABDOMEN PELVIS FINDINGS Hepatobiliary: No focal liver abnormality is seen. Status post cholecystectomy. No biliary dilatation. Pancreas: Unremarkable. No pancreatic ductal dilatation or surrounding inflammatory changes. Spleen: Normal in size without focal abnormality. Adrenals/Urinary Tract: No adrenal nodule bilaterally. No nephrolithiasis, no hydronephrosis, and no contour-deforming renal mass. No ureterolithiasis or hydroureter. The urinary bladder is unremarkable. Stomach/Bowel: Stomach is within normal limits. Appendix appears normal in caliber with and a punctate appendicolith at its tip. No evidence of bowel wall thickening, distention, or inflammatory changes. The cecum is noted to be medialized. Vascular/Lymphatic: No significant vascular findings are present. No fat stranding surrounding the aorta. Mild atherosclerotic plaque. No enlarged abdominal or pelvic lymph nodes. Reproductive: Uterus and bilateral adnexa are unremarkable. Other: No abdominal wall hernia or abnormality. No abdominopelvic ascites. Musculoskeletal: No abdominal wall hernia or abnormality. Fatty degeneration of the left tensor fascia lata muscle. Sclerotic lesion with rings and arcs morphology of the right S1 level (7:105, 3:89) as well as a nodular lesion that a similar within the left iliac bone (7:101). These lesions were previously identified on x-ray abdomen 08/10/2017 and therefore may represent bone island versus enchondromas. Otherwise no suspicious or  new lytic or blastic osseous lesions. Multilevel degenerative changes of the spine. IMPRESSION: 1. No acute intrathoracic or intra-abdominal/intrapelvic traumatic injury on this noncontrast study. 2. Age-indeterminate, possibly acute, T11 vertebral body fracture with greater than 40% height loss. Correlate with tenderness to palpation. 3. Moderate-sized hiatal  hernia. 4.  Aortic Atherosclerosis (ICD10-I70.0). Electronically Signed   By: Tish Frederickson M.D.   On: 08/21/2020 22:04   MR ANGIO HEAD WO CONTRAST  Result Date: 08/22/2020 CLINICAL DATA:  Ataxia, neuro deficit, acute. EXAM: MRI HEAD WITHOUT CONTRAST MRA HEAD WITHOUT CONTRAST MRA NECK WITHOUT AND WITH CONTRAST TECHNIQUE: Multiplanar, multiecho pulse sequences of the brain and surrounding structures were obtained without intravenous contrast. Angiographic images of the Circle of Willis were obtained using MRA technique without intravenous contrast. Angiographic images of the neck were obtained using MRA technique without and with intravenous contrast. Carotid stenosis measurements (when applicable) are obtained utilizing NASCET criteria, using the distal internal carotid diameter as the denominator. CONTRAST:  7mL GADAVIST GADOBUTROL 1 MMOL/ML IV SOLN COMPARISON:  08/21/2020 CT head code stroke. FINDINGS: MRI HEAD FINDINGS Brain: No diffusion-weighted signal abnormality. Sequela of prior bilateral frontal and left temporal insults with superficial siderosis. No acute intracranial hemorrhage. Mild cerebral atrophy with ex vacuo dilatation. Minimal chronic microvascular ischemic changes. No midline shift, ventriculomegaly or extra-axial fluid collection. No mass lesion. Vascular: Please see MRA head. Skull and upper cervical spine: Normal marrow signal. Sinuses/Orbits: Normal orbits. Tiny right maxillary sinus mucous retention cyst. Trace right mastoid effusion. Other: None. MRA HEAD FINDINGS Anterior circulation: Left A1 segment hypoplasia. The bilateral Pcomms are either hypoplastic or absent. No significant stenosis, proximal occlusion, aneurysm, or vascular malformation. Posterior circulation: Dominant right vertebral artery. Dominant right AICA. No significant stenosis, proximal occlusion, aneurysm, or vascular malformation. Venous sinuses: No evidence of thrombosis. Anatomic variants: As detailed above. MRA  NECK FINDINGS There is no high-grade narrowing or focal aneurysm involving the bilateral carotid arteries. No evidence of dissection. Retropharyngeal right bifurcation and right common carotid artery. Retropharyngeal bilateral proximal ECAs and ICAs. The bilateral vertebral arteries are patent and demonstrate antegrade flow. Dominant right vertebral artery. No evidence of high-grade narrowing or focal aneurysm. Three vessel aortic arch. IMPRESSION: No acute intracranial process. Sequela of remote bilateral frontal and left temporal insults. Left frontal superficial siderosis. No evidence of occlusion, high-grade narrowing, dissection or aneurysm involving the major intracranial or neck vessels. Electronically Signed   By: Stana Bunting M.D.   On: 08/22/2020 10:11   MR ANGIO NECK W WO CONTRAST  Result Date: 08/22/2020 CLINICAL DATA:  Ataxia, neuro deficit, acute. EXAM: MRI HEAD WITHOUT CONTRAST MRA HEAD WITHOUT CONTRAST MRA NECK WITHOUT AND WITH CONTRAST TECHNIQUE: Multiplanar, multiecho pulse sequences of the brain and surrounding structures were obtained without intravenous contrast. Angiographic images of the Circle of Willis were obtained using MRA technique without intravenous contrast. Angiographic images of the neck were obtained using MRA technique without and with intravenous contrast. Carotid stenosis measurements (when applicable) are obtained utilizing NASCET criteria, using the distal internal carotid diameter as the denominator. CONTRAST:  7mL GADAVIST GADOBUTROL 1 MMOL/ML IV SOLN COMPARISON:  08/21/2020 CT head code stroke. FINDINGS: MRI HEAD FINDINGS Brain: No diffusion-weighted signal abnormality. Sequela of prior bilateral frontal and left temporal insults with superficial siderosis. No acute intracranial hemorrhage. Mild cerebral atrophy with ex vacuo dilatation. Minimal chronic microvascular ischemic changes. No midline shift, ventriculomegaly or extra-axial fluid collection. No mass  lesion. Vascular: Please see MRA head. Skull and upper cervical spine: Normal marrow signal.  Sinuses/Orbits: Normal orbits. Tiny right maxillary sinus mucous retention cyst. Trace right mastoid effusion. Other: None. MRA HEAD FINDINGS Anterior circulation: Left A1 segment hypoplasia. The bilateral Pcomms are either hypoplastic or absent. No significant stenosis, proximal occlusion, aneurysm, or vascular malformation. Posterior circulation: Dominant right vertebral artery. Dominant right AICA. No significant stenosis, proximal occlusion, aneurysm, or vascular malformation. Venous sinuses: No evidence of thrombosis. Anatomic variants: As detailed above. MRA NECK FINDINGS There is no high-grade narrowing or focal aneurysm involving the bilateral carotid arteries. No evidence of dissection. Retropharyngeal right bifurcation and right common carotid artery. Retropharyngeal bilateral proximal ECAs and ICAs. The bilateral vertebral arteries are patent and demonstrate antegrade flow. Dominant right vertebral artery. No evidence of high-grade narrowing or focal aneurysm. Three vessel aortic arch. IMPRESSION: No acute intracranial process. Sequela of remote bilateral frontal and left temporal insults. Left frontal superficial siderosis. No evidence of occlusion, high-grade narrowing, dissection or aneurysm involving the major intracranial or neck vessels. Electronically Signed   By: Stana Bunting M.D.   On: 08/22/2020 10:11   MR BRAIN WO CONTRAST  Result Date: 08/22/2020 CLINICAL DATA:  Ataxia, neuro deficit, acute. EXAM: MRI HEAD WITHOUT CONTRAST MRA HEAD WITHOUT CONTRAST MRA NECK WITHOUT AND WITH CONTRAST TECHNIQUE: Multiplanar, multiecho pulse sequences of the brain and surrounding structures were obtained without intravenous contrast. Angiographic images of the Circle of Willis were obtained using MRA technique without intravenous contrast. Angiographic images of the neck were obtained using MRA technique  without and with intravenous contrast. Carotid stenosis measurements (when applicable) are obtained utilizing NASCET criteria, using the distal internal carotid diameter as the denominator. CONTRAST:  7mL GADAVIST GADOBUTROL 1 MMOL/ML IV SOLN COMPARISON:  08/21/2020 CT head code stroke. FINDINGS: MRI HEAD FINDINGS Brain: No diffusion-weighted signal abnormality. Sequela of prior bilateral frontal and left temporal insults with superficial siderosis. No acute intracranial hemorrhage. Mild cerebral atrophy with ex vacuo dilatation. Minimal chronic microvascular ischemic changes. No midline shift, ventriculomegaly or extra-axial fluid collection. No mass lesion. Vascular: Please see MRA head. Skull and upper cervical spine: Normal marrow signal. Sinuses/Orbits: Normal orbits. Tiny right maxillary sinus mucous retention cyst. Trace right mastoid effusion. Other: None. MRA HEAD FINDINGS Anterior circulation: Left A1 segment hypoplasia. The bilateral Pcomms are either hypoplastic or absent. No significant stenosis, proximal occlusion, aneurysm, or vascular malformation. Posterior circulation: Dominant right vertebral artery. Dominant right AICA. No significant stenosis, proximal occlusion, aneurysm, or vascular malformation. Venous sinuses: No evidence of thrombosis. Anatomic variants: As detailed above. MRA NECK FINDINGS There is no high-grade narrowing or focal aneurysm involving the bilateral carotid arteries. No evidence of dissection. Retropharyngeal right bifurcation and right common carotid artery. Retropharyngeal bilateral proximal ECAs and ICAs. The bilateral vertebral arteries are patent and demonstrate antegrade flow. Dominant right vertebral artery. No evidence of high-grade narrowing or focal aneurysm. Three vessel aortic arch. IMPRESSION: No acute intracranial process. Sequela of remote bilateral frontal and left temporal insults. Left frontal superficial siderosis. No evidence of occlusion, high-grade  narrowing, dissection or aneurysm involving the major intracranial or neck vessels. Electronically Signed   By: Stana Bunting M.D.   On: 08/22/2020 10:11   ECHOCARDIOGRAM COMPLETE  Result Date: 08/22/2020    ECHOCARDIOGRAM REPORT   Patient Name:   ALBANY WINSLOW Date of Exam: 08/22/2020 Medical Rec #:  [id number]    Height:       61.0 in Accession #:    [id number]   Weight:       173.1 lb Date of Birth:  1966/01/13  BSA:          1.776 m Patient Age:    54 years     BP:           104/71 mmHg Patient Gender: F            HR:           75 bpm. [facility]am [facility] Procedure: 2D Echo Indications:    TIA 435.9 / G45.9  History:        Patient has no prior history of Echocardiogram examinations.                 Risk Factors:Non-Smoker. Traumatic Brain Injury, Asymptomatic                 bacteriuria, Aphasia.  Sonographer:    Jeryl Columbia RDCS (AE) Referring Phys: [id number] DAVID MANUEL ORTIZ IMPRESSIONS  1. Left ventricular ejection fraction, by estimation, is 60 to 65%. The left ventricle has normal function. The left ventricle has no regional wall motion abnormalities. There is mild left ventricular hypertrophy. Left ventricular diastolic parameters were normal.  2. Right ventricular systolic function is normal. The right ventricular size is normal.  3. The mitral valve is normal in structure. Trivial mitral valve regurgitation. No evidence of mitral stenosis.  4. The aortic valve is tricuspid. Aortic valve regurgitation is not visualized. No aortic stenosis is present.  5. The inferior vena cava is normal in size with greater than 50% respiratory variability, suggesting right atrial pressure of 3 mmHg. FINDINGS  Left Ventricle: Left ventricular ejection fraction, by estimation, is 60 to 65%. The left ventricle has normal function. The left ventricle has no regional wall motion abnormalities. The left ventricular internal cavity size was normal in size. There is  mild left ventricular hypertrophy. Left  ventricular diastolic parameters were normal. Right Ventricle: The right ventricular size is normal. No increase in right ventricular wall thickness. Right ventricular systolic function is normal. Left Atrium: Left atrial size was normal in size. Right Atrium: Right atrial size was normal in size. Pericardium: There is no evidence of pericardial effusion. Mitral Valve: The mitral valve is normal in structure. Trivial mitral valve regurgitation. No evidence of mitral valve stenosis. Tricuspid Valve: The tricuspid valve is normal in structure. Tricuspid valve regurgitation is not demonstrated. No evidence of tricuspid stenosis. Aortic Valve: The aortic valve is tricuspid. Aortic valve regurgitation is not visualized. No aortic stenosis is present. Aortic valve mean gradient measures 7.1 mmHg. Aortic valve peak gradient measures 12.8 mmHg. Aortic valve area, by VTI measures 1.79  cm. Pulmonic Valve: The pulmonic valve was not well visualized. Pulmonic valve regurgitation is trivial. No evidence of pulmonic stenosis. Aorta: The aortic root is normal in size and structure. Venous: The inferior vena cava is normal in size with greater than 50% respiratory variability, suggesting right atrial pressure of 3 mmHg. IAS/Shunts: The interatrial septum was not well visualized.  LEFT VENTRICLE PLAX 2D LVIDd:         4.36 cm  Diastology LVIDs:         2.35 cm  LV e' medial:    7.40 cm/s LV PW:         1.21 cm  LV E/e' medial:  14.3 LV IVS:        1.12 cm  LV e' lateral:   10.20 cm/s LVOT diam:     1.90 cm  LV E/e' lateral: 10.4 LV SV:  73 LV SV Index:   41 LVOT Area:     2.84 cm  RIGHT VENTRICLE RV S prime:     12.00 cm/s TAPSE (M-mode): 2.6 cm LEFT ATRIUM             Index       RIGHT ATRIUM           Index LA diam:        3.70 cm 2.08 cm/m  RA Area:     13.20 cm LA Vol (A2C):   36.6 ml 20.61 ml/m RA Volume:   32.00 ml  18.02 ml/m LA Vol (A4C):   52.3 ml 29.45 ml/m LA Biplane Vol: 43.2 ml 24.32 ml/m  AORTIC VALVE  AV Area (Vmax):    2.22 cm AV Area (Vmean):   1.87 cm AV Area (VTI):     1.79 cm AV Vmax:           179.17 cm/s AV Vmean:          125.693 cm/s AV VTI:            0.407 m AV Peak Grad:      12.8 mmHg AV Mean Grad:      7.1 mmHg LVOT Vmax:         140.54 cm/s LVOT Vmean:        82.913 cm/s LVOT VTI:          0.257 m LVOT/AV VTI ratio: 0.63  AORTA Ao Root diam: 2.50 cm MITRAL VALVE MV Area (PHT): 4.06 cm     SHUNTS MV Decel Time: 187 msec     Systemic VTI:  0.26 m MV E velocity: 106.00 cm/s  Systemic Diam: 1.90 cm MV A velocity: 81.40 cm/s MV E/A ratio:  1.30 Dina Rich MD Electronically signed by Dina Rich MD Signature Date/Time: 08/22/2020/4:02:43 PM    Final    CT HEAD CODE STROKE WO CONTRAST  Result Date: 08/21/2020 CLINICAL DATA:  Code stroke. Initial evaluation for acute altered mental status, confusion. EXAM: CT HEAD WITHOUT CONTRAST TECHNIQUE: Contiguous axial images were obtained from the base of the skull through the vertex without intravenous contrast. COMPARISON:  None available. FINDINGS: Brain: Age-related cerebral atrophy with mild chronic small vessel ischemic disease. Scattered areas of multifocal encephalomalacia involving the anterior left frontotemporal region most likely related to remote trauma. No acute intracranial hemorrhage. No acute large vessel territory infarct. No mass lesion, mass effect, or midline shift. No hydrocephalus or extra-axial fluid collection. Vascular: No hyperdense vessel. Skull: Scalp soft tissues within normal limits. Calvarium intact. Postsurgical changes partially visualize within the upper cervical spine. Sinuses/Orbits: Globes and orbital soft tissues within normal limits. Paranasal sinuses are clear. No mastoid effusion. Other: None. ASPECTS [facility] Stroke Program Early CT Score) - Ganglionic level infarction (caudate, lentiform nuclei, internal capsule, insula, M1-M3 cortex): 7 - Supraganglionic infarction (M4-M6 cortex): 3 Total score (0-10 with  10 being normal): 10 IMPRESSION: 1. No acute intracranial infarct or other abnormality. 2. ASPECTS is 10. 3. Chronic left frontotemporal encephalomalacia, most likely related to remote trauma. 4. Underlying age-related cerebral atrophy with mild chronic small vessel ischemic disease. Critical Value/emergent results were called by telephone at the time of interpretation on 08/21/2020 at 9:37 pm to provider JOSEPH ZAMMIT , who verbally acknowledged these results. Electronically Signed   By: Rise Mu M.D.   On: 08/21/2020 21:39        Scheduled Meds: . [MAR Hold]  stroke: mapping our early stages of recovery book  Does not apply Once  . [MAR Hold] pantoprazole (PROTONIX) IV  40 mg Intravenous Q12H   Continuous Infusions: . sodium chloride    . lactated ringers 75 mL/hr at 08/23/20 0100     LOS: 1 day    Time spent: 35 minutes    Alexsis Branscom Hoover Brunette, DO [facility]  If 7PM-7AM, please contact night-coverage [email or web address] 08/23/2020, 2:19 PM
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
## 2020-08-23 NOTE — Anesthesia Postprocedure Evaluation (Signed)
Anesthesia Post Note  Patient: Kylie Wells  Procedure(s) Performed: ESOPHAGOGASTRODUODENOSCOPY (EGD) WITH PROPOFOL (N/A ) BIOPSY  Patient location during evaluation: PACU Anesthesia Type: General Level of consciousness: awake and alert and patient cooperative Pain management: satisfactory to patient Vital Signs Assessment: post-procedure vital signs reviewed and stable Respiratory status: spontaneous breathing Cardiovascular status: stable Postop Assessment: no apparent nausea or vomiting Anesthetic complications: no   No complications documented.   Last Vitals:  Vitals:   08/23/20 1302 08/23/20 1430  BP: (!) 139/59 (!) 94/47  Pulse: 68 74  Resp: 20 18  Temp: 37.2 C 36.9 C  SpO2: 96% 98%    Last Pain:  Vitals:   08/23/20 1430  TempSrc:   PainSc: 0-No pain                 Landen Knoedler

## 2020-08-23 NOTE — Progress Notes (Signed)
Reviewed chart with pending EGD today. Hgb stable this morning at 7.3 (increase to 7.5 yesterday with 3 units PRBC). Neuro status improved per hospitalist. Anticipate EGD today as planned.  Thank you for allowing [place] to participate in the care of Kylie Assunta Found, DNP, AGNP-C Adult & Gerontological Nurse Practitioner [facility]

## 2020-08-23 NOTE — Op Note (Signed)
[facility] Patient Name: Kylie Wells Procedure Date: 08/23/2020 2:04 PM MRN: [id number] Date of Birth: 05/17/1966 Attending MD: Elon Alas. Abbey Chatters DO CSN: [id number] Age: 54 Admit Type: Inpatient Procedure:                Upper GI endoscopy Indications:              Iron deficiency anemia Providers:                Elon Alas. Abbey Chatters, DO, Cowlic Page, Caprice Kluver,                            Charlsie Quest Theda Sers RN, RN Referring MD:              Medicines:                See the Anesthesia note for documentation of the                            administered medications Complications:            No immediate complications. Estimated Blood Loss:     Estimated blood loss was minimal. Procedure:                Pre-Anesthesia Assessment:                           - The anesthesia plan was to use monitored                            anesthesia care (MAC).                           After obtaining informed consent, the endoscope was                            passed under direct vision. Throughout the                            procedure, the patient's blood pressure, pulse, and                            oxygen saturations were monitored continuously. The                            [id number]-[id number] ([id number]) scope was introduced through the                            mouth, and advanced to the second part of duodenum.                            The upper GI endoscopy was accomplished without                            difficulty. The patient tolerated the procedure                            well. Scope  In: 2:19:31 PM Scope Out: 2:22:20 PM Total Procedure Duration: 0 hours 2 minutes 49 seconds  Findings:      A medium-sized hiatal hernia was present.      There is no endoscopic evidence of bleeding, areas of erosion,       esophagitis, inflammation, ulcerations or varices in the entire       esophagus.      Localized mild inflammation characterized by erythema was found in the        gastric antrum. Biopsies were taken with a cold forceps for Helicobacter       pylori testing.      The duodenal bulb, first portion of the duodenum and second portion of       the duodenum were normal. Impression:               - Medium-sized hiatal hernia.                           - Gastritis. Biopsied.                           - Normal duodenal bulb, first portion of the                            duodenum and second portion of the duodenum. Moderate Sedation:      Per Anesthesia Care Recommendation:           - Return patient to hospital ward for ongoing care.                           - Advance diet as tolerated.                           - Use Protonix (pantoprazole) 40 mg PO daily.                           - No identifiable cause of anemia found on EGD. We                            may need to update her colonoscopy in the                            outpatient setting. We may also consider capsule                            endoscopy. Procedure Code(s):        --- Professional ---                           [phone], Esophagogastroduodenoscopy, flexible,                            transoral; with biopsy, single or multiple Diagnosis Code(s):        --- Professional ---                           K44.9, Diaphragmatic hernia without obstruction or  gangrene                           K29.70, Gastritis, unspecified, without bleeding                           D50.9, Iron deficiency anemia, unspecified CPT copyright 2019 American Medical Association. All rights reserved. The codes documented in this report are preliminary and upon coder review may  be revised to meet current compliance requirements. Elon Alas. Abbey Chatters, DO [place] Abbey Chatters, DO 08/23/2020 2:27:27 PM This report has been signed electronically. Number of Addenda: 0

## 2020-08-23 NOTE — Transfer of Care (Signed)
Immediate Anesthesia Transfer of Care Note  Patient: Kylie Wells  Procedure(s) Performed: ESOPHAGOGASTRODUODENOSCOPY (EGD) WITH PROPOFOL (N/A ) BIOPSY  Patient Location: PACU  Anesthesia Type:General  Level of Consciousness: awake, alert  and patient cooperative  Airway & Oxygen Therapy: Patient Spontanous Breathing  Post-op Assessment: Report given to RN and Post -op Vital signs reviewed and stable  Post vital signs: Reviewed and stable  Last Vitals:  Vitals Value Taken Time  BP 94/47 08/23/20 1430  Temp    Pulse 63 08/23/20 1431  Resp 15 08/23/20 1431  SpO2 97 % 08/23/20 1431  Vitals shown include unvalidated device data.  Last Pain:  Vitals:   08/23/20 1412  TempSrc:   PainSc: 0-No pain      Patients Stated Pain Goal: 7 (08/23/20 1302)  Complications: No complications documented.
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
## 2020-08-24 LAB — BASIC METABOLIC PANEL
Anion gap: 11 (ref 5–15)
BUN: 10 mg/dL (ref 6–20)
CO2: 25 mmol/L (ref 22–32)
Calcium: 8.6 mg/dL — ABNORMAL LOW (ref 8.9–10.3)
Chloride: 104 mmol/L (ref 98–111)
Creatinine, Ser: 0.51 mg/dL (ref 0.44–1.00)
GFR calc Af Amer: >60 mL/min (ref >60)
GFR calc non Af Amer: >60 mL/min (ref >60)
Glucose, Bld: 93 mg/dL (ref 70–99)
Potassium: 3.6 mmol/L (ref 3.5–5.1)
Sodium: 140 mmol/L (ref 135–145)

## 2020-08-24 LAB — CBC
HCT: 26.3 % — ABNORMAL LOW (ref 36.0–46.0)
Hemoglobin: 7.4 g/dL — ABNORMAL LOW (ref 12.0–15.0)
MCH: 22.8 pg — ABNORMAL LOW (ref 26.0–34.0)
MCHC: 28.1 g/dL — ABNORMAL LOW (ref 30.0–36.0)
MCV: 80.9 fL (ref 80.0–100.0)
Platelets: 303 10*3/uL (ref 150–400)
RBC: 3.25 MIL/uL — ABNORMAL LOW (ref 3.87–5.11)
RDW: 23.1 % — ABNORMAL HIGH (ref 11.5–15.5)
WBC: 5.7 10*3/uL (ref 4.0–10.5)
nRBC: 0.5 % — ABNORMAL HIGH (ref 0.0–0.2)

## 2020-08-24 NOTE — Progress Notes (Addendum)
Pt transferred to [facility] in stable condition via [facility]. Pain medication given prior to leaving. Report given to Michele Mcalpine, RN.

## 2020-08-24 NOTE — Progress Notes (Addendum)
PROGRESS NOTE    Kylie Wells  QMG:[id number] DOB: 03-08-1966 DOA: 08/21/2020 PCP: Pcp, No   Brief Narrative:  Per HPI: Kylie Cooperis a 54 y.o.femalewith medical history significant ofGERD, TBI in 2018 who was brought to the emergency department via EMS due to a possible occult stroke. Her husband stated that he last saw her normal around 1430 earlier today, she took hydrocodone and a muscle relaxant earlier and went to sleep. When she woke up about 4 hours later, she was very confused. EMS stated that she was altered on their initial assessment. The patient has been taking analgesics and muscle relaxants after having a fall over the weekend and diagnosed with musculoskeletal pain on her back. Her husband states that she has been taking a lot of ibuprofen until recently due to chronic left foot pain. She was scheduled to have surgery on her left foot to try to correct this issue, but this was postponed due to the COVID-19 delta varianthealthcare crisis. They deny vaginal bleed, melena or hematemesis. The husband stated that she has been sleeping more than usual. He also noticed that her systolic BP was in the low 100swhen she was at the urgent care, which is unusual for her. No fever, chills, headache, sore throat, rhinorrhea, wheezing or hemoptysis. No chest pain, palpitations, diaphoresis, PND, orthopnea or recent pitting edema of the lower extremities. No abdominal pain, diarrhea, constipation, melena or hematochezia. No dysuria, frequency or hematuria. Denies polyuria, polydipsia, polyphagia or blurred vision.  9/23:According to husband at bedside, patient had a fall several days ago on Saturday of last week after which point in time she had excruciating low back pain. She went to urgent care yesterday and received some pain medications to include baclofen, Vicodin, and a prednisone taper and shortly thereafter became quite confused. She has received 3 units of PRBCs for her  severe anemia with no overt bleeding and hemoglobin has improved to 7.5. She continues to have low back pain symptoms. MRI imaging with no sign of CVA noted.  9/24: Hemoglobin remained stable this morning and confusion is improving.  Plans for EGD this morning per GI.  9/25: EGD performed on 9/24 with findings of mild gastritis noted.  2D echocardiogram with LVEF 60-65% and mild LVH as well.  No acute bleeding noted thus far and hemoglobin has remained stable this morning.  She is stable for transfer to [facility] per recommendation from radiology in order to perform kyphoplasty on 9/27.  She will require thoracic MRI to further evaluate her compression fracture T11 and therefore she will be arranged for transfer today to have imaging performed with further evaluation per radiology by 9/27 for her procedure.  She continues to have significant mid back pain.  Assessment & Plan:   Principal Problem:   Aphasia Active Problems:   GERD (gastroesophageal reflux disease)   Microcytic anemia   Asymptomatic bacteriuria   Class 1 obesity   Compression fracture of T11 vertebra (HCC)   Acute blood loss anemia   Acute encephalopathy likely toxic--resolved -This was likely a result of combination of muscle relaxers and opioids in outpatient setting -MRI studies negative for CVA -2D echocardiogram with no acute findings  Severe acute blood loss anemia-stable -EGD performed 9/24 with findings of mild gastritis and no signs of bleeding.  GI recommends continuing on PPI with further outpatient follow-up with GI in [place] to consider small capsule endoscopy as well as colonoscopy at that time -Hemoglobin improved to 7.5 after 3 unit PRBC transfusion and  remains at 7.4 this am -Recheck CBC in a.m. -Maintain on PPI as ordered -No overt bleeding otherwise noted  Lumbago secondary to T11 compression fracture-with ongoing severe pain -Minimal pain medications as needed, otherwise bedrest -Lidocaine  patch ordered -Scheduled Tylenol to continue -Discussed case with Dr. Corliss Skains on 9/24 with recommendations for MRI T-spine as well as transfer to internal medicine service for IR evaluation by 9/27 for kyphoplasty given severe pain -MRI T-spine ordered per radiology recommendations  GERD -Continue PPI as ordered, changed to oral 9/24  Obesity -Lifestyle changes outpatient   DVT prophylaxis:SCDs Code Status:Full Family Communication:Husband at bedside 9/24 Disposition Plan: Status is: Inpatient  Remains inpatient appropriate because:IV treatments appropriate due to intensity of illness or inability to take PO and Inpatient level of care appropriate due to severity of illness   Dispo: The patient is from: Home  Anticipated d/c is to: Home  Anticipated d/c date is: 2-3 days  Patient currently is not medically stable to d/c.  She will require kyphoplasty per IR on 9/27 due to severe mid back pain which is persistent on account of T11 compression fracture from recent fall.  She will require transfer to [facility] for this and will also need MRI.   Consultants:  GI  IR-Dr. Corliss Skains please page him at [phone] or call cell at [phone] by 9/26  Procedures:  See below  Antimicrobials:   None  Subjective: Patient seen and evaluated today with ongoing severe mid back pain noted.  Otherwise no other complaints noted.  No overt bleeding identified.  Objective: Vitals:   08/23/20 2049 08/24/20 0026 08/24/20 0516 08/24/20 0800  BP: (!) 141/68 130/75 (!) 127/58 (!) 142/81  Pulse: 76 66 68 78  Resp: Temp: 98.4 F (36.9 C) 97.9 F (36.6 C) 98.8 F (37.1 C) 98.8 F (37.1 C)  TempSrc: Oral Oral Oral Oral  SpO2: 97% 100% 98% 100%  Weight:      Height:        Intake/Output Summary (Last 24 hours) at 08/24/2020 1040 Last data filed at 08/23/2020 1445 Gross per 24 hour  Intake 400 ml  Output --  Net  400 ml   Filed Weights   08/21/20 2104 08/22/20 2020 08/23/20 1302  Weight: 78.5 kg 81.1 kg 81.1 kg    Examination:  General exam: Appears calm and comfortable  Respiratory system: Clear to auscultation. Respiratory effort normal. Cardiovascular system: S1 & S2 heard, RRR.  Gastrointestinal system: Abdomen is nondistended, soft and nontender.  Central nervous system: Alert and oriented. No focal neurological deficits. Extremities: Symmetric 5 x 5 power. Skin: No rashes, lesions or ulcers Psychiatry: Judgement and insight appear normal. Mood & affect appropriate.     Data Reviewed: I have personally reviewed following labs and imaging studies  CBC: Recent Labs  Lab 08/21/20 2135 08/21/20 2232 08/22/20 0525 08/23/20 0705 08/24/20 0714  WBC 7.9 8.0 9.2 6.1 5.7  NEUTROABS 7.0 6.9  --   --   --   HGB 3.7* 3.7* 7.5* 7.3* 7.4*  HCT 15.3* 16.1* 26.8* 25.9* 26.3*  MCV 72.2* 74.2* 80.5 80.9 80.9  PLT 363 PLATELET CLUMPS NOTED ON SMEAR, UNABLE TO ESTIMATE 339 326 303   Basic Metabolic Panel: Recent Labs  Lab 08/21/20 2135 08/23/20 0705 08/24/20 0714  NA 138 139 140  K 3.5 3.6 3.6  CL 108 108 104  CO2 19* 22 25  GLUCOSE 139* 91 93  BUN CREATININE 0.59 0.50 0.51  CALCIUM 8.8* 8.4* 8.6*  MG  --  1.9  --    GFR: Estimated Creatinine Clearance: 81.2 mL/min (by C-G formula based on SCr of 0.51 mg/dL). Liver Function Tests: Recent Labs  Lab 08/21/20 2135  AST 19  ALT 26  ALKPHOS 60  BILITOT 0.7  PROT 7.4  ALBUMIN 3.6   No results for input(s): LIPASE, AMYLASE in the last 168 hours. No results for input(s): AMMONIA in the last 168 hours. Coagulation Profile: Recent Labs  Lab 08/21/20 2135  INR 1.1   Cardiac Enzymes: No results for input(s): CKTOTAL, CKMB, CKMBINDEX, TROPONINI in the last 168 hours. BNP (last 3 results) No results for input(s): PROBNP in the last 8760 hours. HbA1C: Recent Labs    08/21/20 2135  HGBA1C 5.0   CBG: Recent Labs   Lab 08/21/20 2140  GLUCAP 139*   Lipid Profile: Recent Labs    08/22/20 0525  CHOL 121  HDL 29*  LDLCALC 70  TRIG 811110  CHOLHDL 4.2   Thyroid Function Tests: No results for input(s): TSH, T4TOTAL, FREET4, T3FREE, THYROIDAB in the last 72 hours. Anemia Panel: No results for input(s): VITAMINB12, FOLATE, FERRITIN, TIBC, IRON, RETICCTPCT in the last 72 hours. Sepsis Labs: No results for input(s): PROCALCITON, LATICACIDVEN in the last 168 hours.  Recent Results (from the past 240 hour(s))  SARS Coronavirus 2 by RT PCR (hospital order[facility]) Nasopharyngeal Nasopharyngeal Swab     Status: None   Collection Time: 08/21/20  9:45 PM   Specimen: Nasopharyngeal Swab  Result Value Ref Range Status   SARS Coronavirus 2 NEGATIVE NEGATIVE Final    Comment: (NOTE) SARS-CoV-2 target nucleic acids are NOT DETECTED.  The SARS-CoV-2 RNA is generally detectable in upper and lower respiratory specimens during the acute phase of infection. The lowest concentration of SARS-CoV-2 viral copies this assay can detect is 250 copies / mL. A negative result does not preclude SARS-CoV-2 infection and should not be used as the sole basis for treatment or other patient management decisions.  A negative result may occur with improper specimen collection / handling, submission of specimen other than nasopharyngeal swab, presence of viral mutation(s) within the areas targeted by this assay, and inadequate number of viral copies (<250 copies / mL). A negative result must be combined with clinical observations, patient history, and epidemiological information.  Fact Sheet for Patients:   BoilerBrush.com.cyhttps://www.fda.gov/media/136312/download  Fact Sheet for Healthcare Providers: https://pope.com/https://www.fda.gov/media/136313/download  This test is not yet approved or  cleared by the [place] FDA and has been authorized for detection and/or diagnosis of SARS-CoV-2 by FDA under an Emergency Use  Authorization (EUA).  This EUA will remain in effect (meaning this test can be used) for the duration of the COVID-19 declaration under Section 564(b)(1) of the Act, 21 U.S.C. section 360bbb-3(b)(1), unless the authorization is terminated or revoked sooner.  Performed at [facility], [address] [place], [place] [address]          Radiology Studies: ECHOCARDIOGRAM COMPLETE  Result Date: 08/22/2020    ECHOCARDIOGRAM REPORT   Patient Name:   Dwyane DeeAMMY Agan Date of Exam: 08/22/2020 Medical Rec #:  [id number]    Height:       61.0 in Accession #:    [id number]   Weight:       173.1 lb Date of Birth:  1965/12/21    BSA:          1.776 m Patient Age:    [age] years  BP:           104/71 mmHg Patient Gender: F            HR:           75 bpm. [facility]am [facility] Procedure: 2D Echo Indications:    TIA 435.9 / G45.9  History:        Patient has no prior history of Echocardiogram examinations.                 Risk Factors:Non-Smoker. Traumatic Brain Injury, Asymptomatic                 bacteriuria, Aphasia.  Sonographer:    Jeryl Columbia RDCS (AE) Referring Phys: [id number] DAVID MANUEL ORTIZ IMPRESSIONS  1. Left ventricular ejection fraction, by estimation, is 60 to 65%. The left ventricle has normal function. The left ventricle has no regional wall motion abnormalities. There is mild left ventricular hypertrophy. Left ventricular diastolic parameters were normal.  2. Right ventricular systolic function is normal. The right ventricular size is normal.  3. The mitral valve is normal in structure. Trivial mitral valve regurgitation. No evidence of mitral stenosis.  4. The aortic valve is tricuspid. Aortic valve regurgitation is not visualized. No aortic stenosis is present.  5. The inferior vena cava is normal in size with greater than 50% respiratory variability, suggesting right atrial pressure of 3 mmHg. FINDINGS  Left Ventricle: Left ventricular ejection fraction, by estimation, is 60 to 65%. The  left ventricle has normal function. The left ventricle has no regional wall motion abnormalities. The left ventricular internal cavity size was normal in size. There is  mild left ventricular hypertrophy. Left ventricular diastolic parameters were normal. Right Ventricle: The right ventricular size is normal. No increase in right ventricular wall thickness. Right ventricular systolic function is normal. Left Atrium: Left atrial size was normal in size. Right Atrium: Right atrial size was normal in size. Pericardium: There is no evidence of pericardial effusion. Mitral Valve: The mitral valve is normal in structure. Trivial mitral valve regurgitation. No evidence of mitral valve stenosis. Tricuspid Valve: The tricuspid valve is normal in structure. Tricuspid valve regurgitation is not demonstrated. No evidence of tricuspid stenosis. Aortic Valve: The aortic valve is tricuspid. Aortic valve regurgitation is not visualized. No aortic stenosis is present. Aortic valve mean gradient measures 7.1 mmHg. Aortic valve peak gradient measures 12.8 mmHg. Aortic valve area, by VTI measures 1.79  cm. Pulmonic Valve: The pulmonic valve was not well visualized. Pulmonic valve regurgitation is trivial. No evidence of pulmonic stenosis. Aorta: The aortic root is normal in size and structure. Venous: The inferior vena cava is normal in size with greater than 50% respiratory variability, suggesting right atrial pressure of 3 mmHg. IAS/Shunts: The interatrial septum was not well visualized.  LEFT VENTRICLE PLAX 2D LVIDd:         4.36 cm  Diastology LVIDs:         2.35 cm  LV e' medial:    7.40 cm/s LV PW:         1.21 cm  LV E/e' medial:  14.3 LV IVS:        1.12 cm  LV e' lateral:   10.20 cm/s LVOT diam:     1.90 cm  LV E/e' lateral: 10.4 LV SV:         73 LV SV Index:   41 LVOT Area:     2.84 cm  RIGHT VENTRICLE RV S prime:  12.00 cm/s TAPSE (M-mode): 2.6 cm LEFT ATRIUM             Index       RIGHT ATRIUM           Index LA  diam:        3.70 cm 2.08 cm/m  RA Area:     13.20 cm LA Vol (A2C):   36.6 ml 20.61 ml/m RA Volume:   32.00 ml  18.02 ml/m LA Vol (A4C):   52.3 ml 29.45 ml/m LA Biplane Vol: 43.2 ml 24.32 ml/m  AORTIC VALVE AV Area (Vmax):    2.22 cm AV Area (Vmean):   1.87 cm AV Area (VTI):     1.79 cm AV Vmax:           179.17 cm/s AV Vmean:          125.693 cm/s AV VTI:            0.407 m AV Peak Grad:      12.8 mmHg AV Mean Grad:      7.1 mmHg LVOT Vmax:         140.54 cm/s LVOT Vmean:        82.913 cm/s LVOT VTI:          0.257 m LVOT/AV VTI ratio: 0.63  AORTA Ao Root diam: 2.50 cm MITRAL VALVE MV Area (PHT): 4.06 cm     SHUNTS MV Decel Time: 187 msec     Systemic VTI:  0.26 m MV E velocity: 106.00 cm/s  Systemic Diam: 1.90 cm MV A velocity: 81.40 cm/s MV E/A ratio:  1.30 Dina Rich MD Electronically signed by Dina Rich MD Signature Date/Time: 08/22/2020/4:02:43 PM    Final         Scheduled Meds:   stroke: mapping our early stages of recovery book   Does not apply Once   acetaminophen  650 mg Oral Q6H   lidocaine  1 patch Transdermal Q24H   pantoprazole  40 mg Oral Daily    LOS: 2 days    Time spent: 35 minutes    Kylie Wells Hoover Brunette, DO [facility]  If 7PM-7AM, please contact night-coverage [email or web address] 08/24/2020, 10:40 AM

## 2020-08-24 NOTE — Progress Notes (Signed)
Pt arrived from [facility], alert and oriental, c/o of slight back pain with a rating of 4, settled in bed with call light within pt's reach, tele monitor put and verified on pt, admit doc paged and notified of pt's arrival, safety concern also explained and initiated, was however reassured and will continue to monitor, v/s stable. Kylie Wells, [name]

## 2020-08-25 ENCOUNTER — Inpatient Hospital Stay (HOSPITAL_COMMUNITY): Payer: Medicare HMO

## 2020-08-25 LAB — CBC
HCT: 25.8 % — ABNORMAL LOW (ref 36.0–46.0)
Hemoglobin: 7.1 g/dL — ABNORMAL LOW (ref 12.0–15.0)
MCH: 21.9 pg — ABNORMAL LOW (ref 26.0–34.0)
MCHC: 27.5 g/dL — ABNORMAL LOW (ref 30.0–36.0)
MCV: 79.6 fL — ABNORMAL LOW (ref 80.0–100.0)
Platelets: 317 10*3/uL (ref 150–400)
RBC: 3.24 MIL/uL — ABNORMAL LOW (ref 3.87–5.11)
RDW: 23 % — ABNORMAL HIGH (ref 11.5–15.5)
WBC: 5.5 10*3/uL (ref 4.0–10.5)
nRBC: 0.4 % — ABNORMAL HIGH (ref 0.0–0.2)

## 2020-08-25 LAB — GLUCOSE, CAPILLARY: Glucose-Capillary: 96 mg/dL (ref 70–99)

## 2020-08-25 IMAGING — MR MR THORACIC SPINE W/O CM
9 of 12 series · 31 of 48 positions shown · non-contrast
Comparison: Chest CT from 4 days ago

CLINICAL DATA: T11 compression fracture

EXAM:
MRI THORACIC SPINE WITHOUT CONTRAST
TECHNIQUE: Multiplanar, multisequence MR imaging of the thoracic spine was
performed. No intravenous contrast was administered.

[Series 12: T1 · sagittal · 5.0mm · 1.46mm/px · 2 of 9 slices shown (1 of 4)]
[im 1/9]
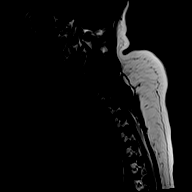
[im 9/9]
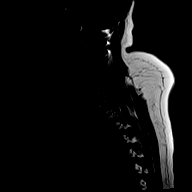

[Series 13: T1 · sagittal · 5.0mm · 1.09mm/px · 2 of 9 slices shown (2 of 4)]
[im 1/9]
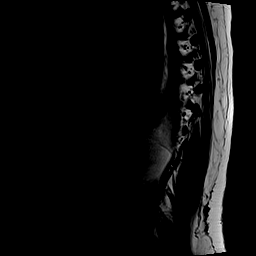
[im 9/9]
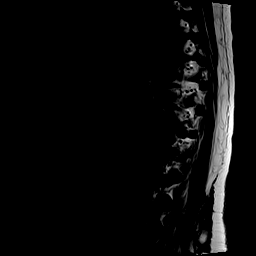

[Series 14: T1 · sagittal · 6.0mm · 1.09mm/px · 1 of 9 slices shown (3 of 4)]
[im 1/9]
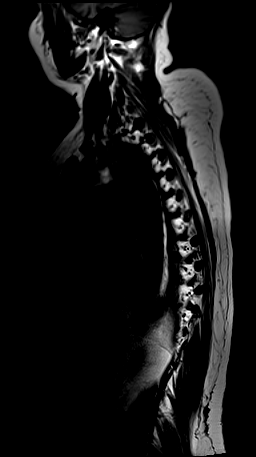

[Series 16: T2 · sagittal · 3.0mm · 0.62mm/px · 3 of 19 slices shown (1 of 4)]
[im 1/19]
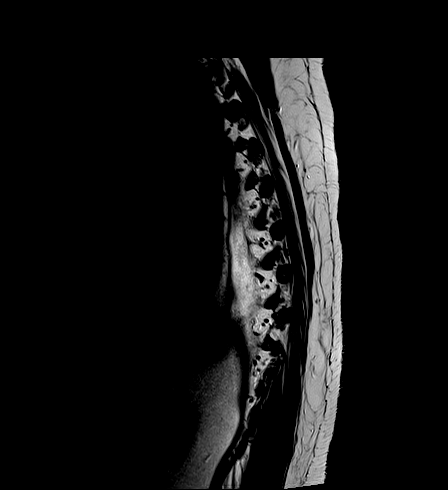
[im 10/19]
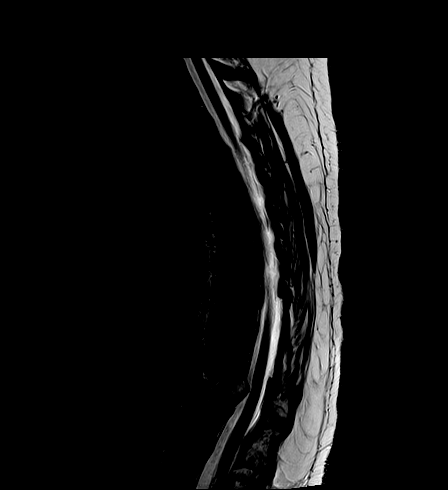
[im 19/19]
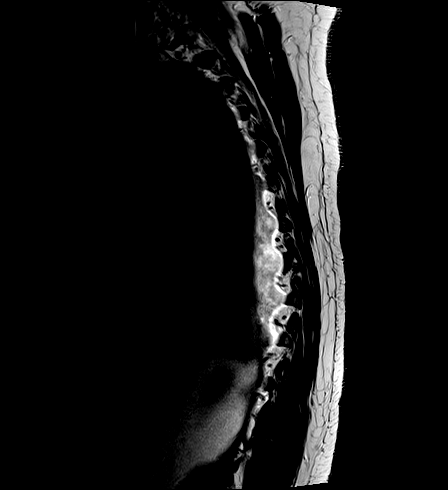

[Series 17: T1 · sagittal · 3.0mm · 0.62mm/px · 3 of 19 slices shown (4 of 4)]
[im 1/19]
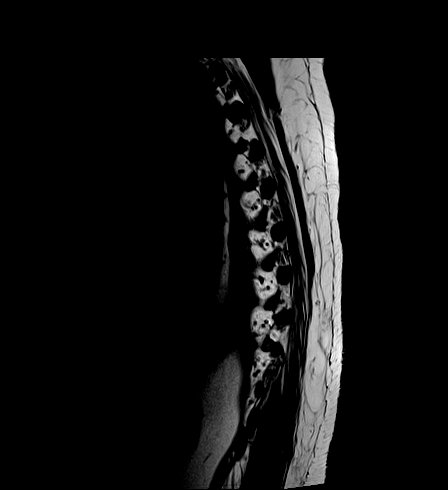
[im 10/19]
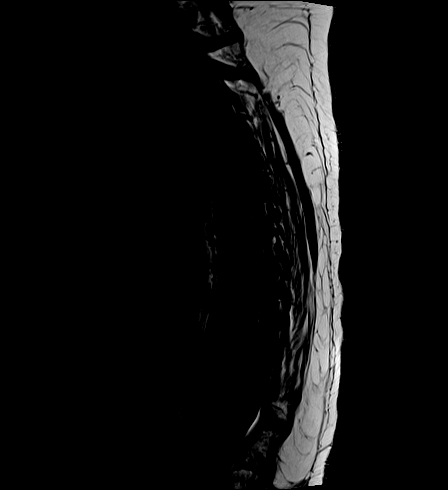
[im 19/19]
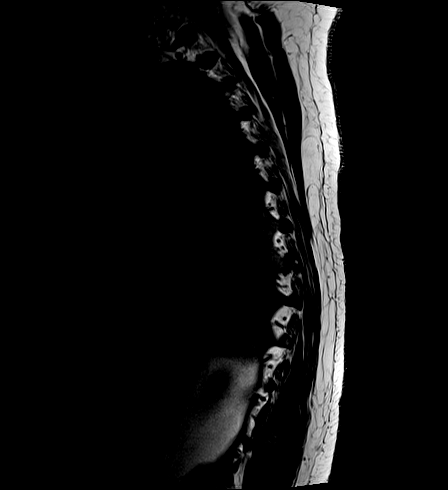

[Series 18: STIR · sagittal · 3.0mm · 0.40mm/px · 3 of 19 slices shown]
[im 1/19]
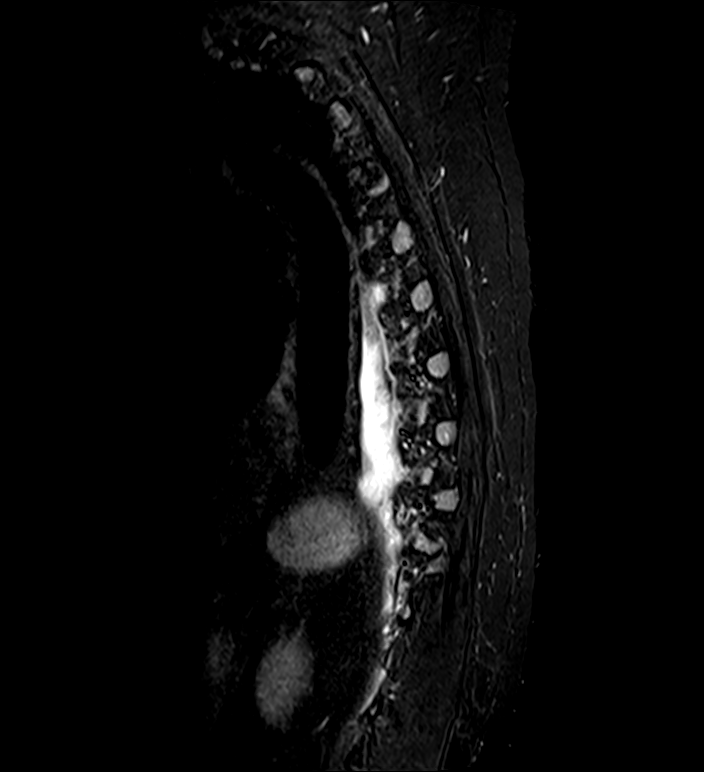
[im 10/19]
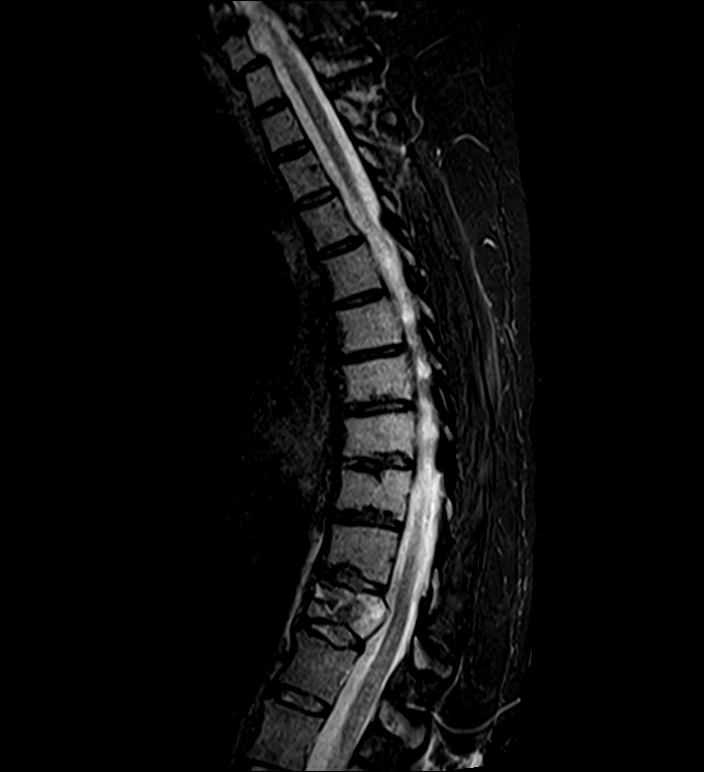
[im 19/19]
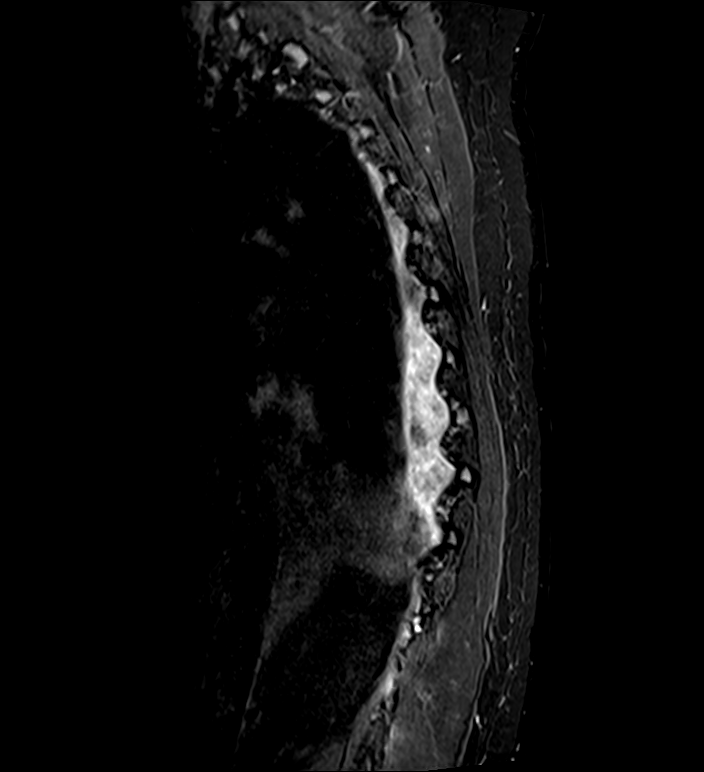

[Series 19: T2 · axial · 4.0mm · 0.66mm/px · z∈[-98,+25]mm · 5 of 30 slices shown (2 of 4)]
[im 1/30]
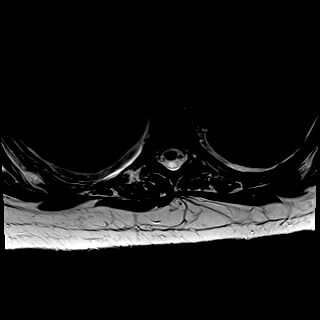
[im 8/30]
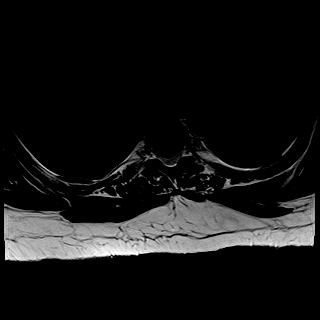
[im 15/30]
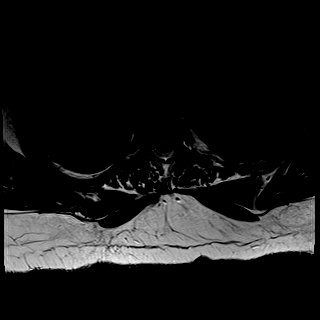
[im 22/30]
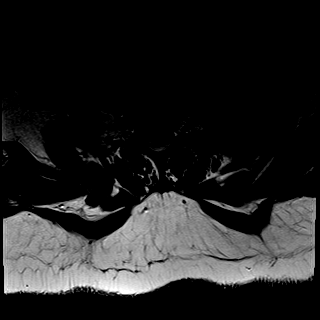
[im 30/30]
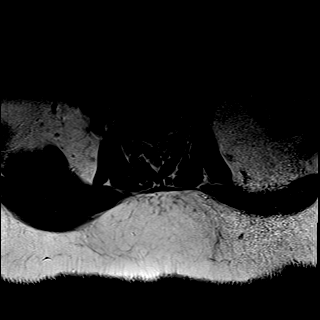

[Series 20: T2 · axial · 5.0mm · 0.66mm/px · z∈[-240,-53]mm · 4 of 26 slices shown (3 of 4)]
[im 1/26]
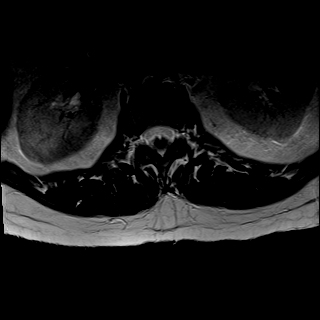
[im 9/26]
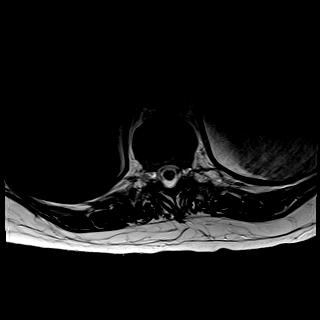
[im 17/26]
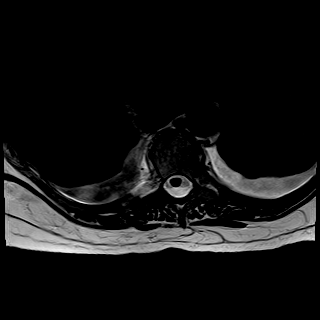
[im 26/26]
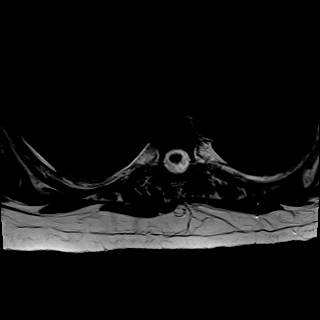

[Series 21: T2 · axial · 5.0mm · 0.66mm/px · z∈[-240,+25]mm · 8 of 53 slices shown (4 of 4)]
[im 1/53]
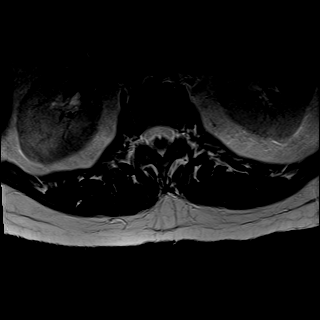
[im 8/53]
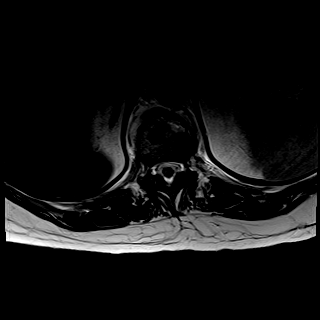
[im 15/53]
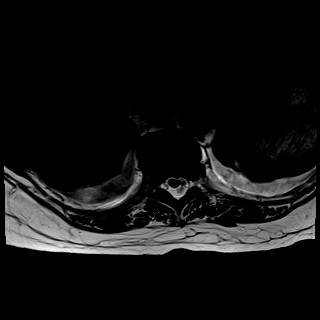
[im 23/53]
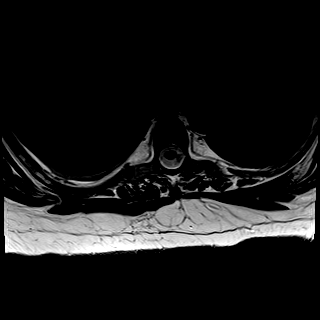
[im 30/53]
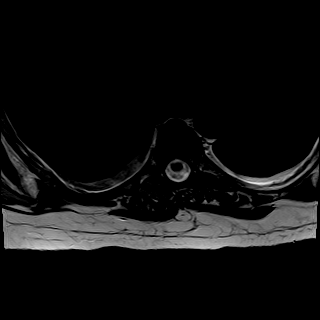
[im 38/53]
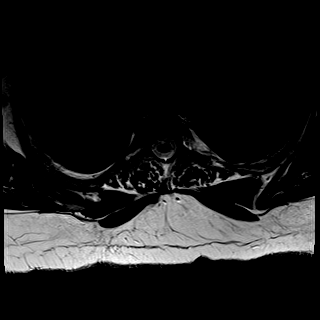
[im 45/53]
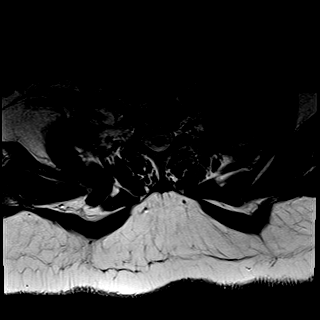
[im 53/53]
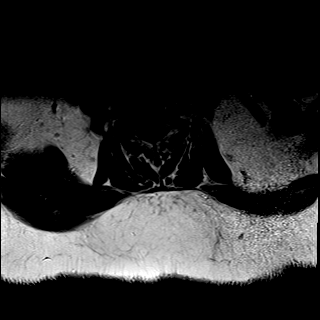

[31 of 48 positions shown; findings below may reference images not displayed]

FINDINGS: Alignment: No traumatic malalignment. C7-T1 anterolisthesis related
to advanced facet osteoarthritis.

Vertebrae: T11 compression fracture with diffuse marrow edema around
a branching fracture plane. Mild paravertebral edema is present.
Compared to T10 height loss measures 40%. There is retropulsion by 4
mm, without cord compression.

No evidence of underlying bone lesion.

Cord:  Normal signal.  No evidence of intrathecal collection

Paraspinal and other soft tissues: Small layering pleural effusions.
Paravertebral edema related to the fracture.

Disc levels:

Tiny disc protrusions seen at T6-7, T8-9, and T9-10. Negative
facets. No neural impingement.
IMPRESSION: Acute T11 compression fracture with 40% height loss and 3 mm of
retropulsion. Retropulsed bone contacts the cord without
compression.

## 2020-08-25 MED ORDER — POLYETHYLENE GLYCOL 3350 17 G PO PACK
17.0000 g | PACK | Freq: Every day | ORAL | Status: DC | PRN
  Administered 2020-08-25: 17 g via ORAL
  Filled 2020-08-25: qty 1

## 2020-08-25 NOTE — Progress Notes (Signed)
PROGRESS NOTE  Kylie Wells  DOB: 10/06/1966  PCP: Aviva Kluver IOE:[id number]  DOA: 08/21/2020  LOS: 3 days   Chief Complaint  Patient presents with  . Altered Mental Status    Brief narrative: Kylie Wells is a 54 y.o. female with PMH of GERD, TBI in 2018 and chronic left foot pain on Advil She was brought to the ED at [facility] on 9/22 for altered mental status.  Evidently she had a fall few days ago, sustained excruciating lower back pain and took more Advil and muscle relaxants.  9/22, she went to an urgent care sent and was started on prednisone taper.  She became more confused and was hence brought to the ED.  In the ED, her hemoglobin was low at 3.7.  She was given 3 units of PRBC. She was admitted to hospitalist service. MRI brain was obtained for altered mental status which did not show any evidence of a stroke. 9/24, she underwent EGD that showed mild gastritis only.  Her hemoglobin level is stabilized 9/25, she was transferred to [facility] for MRI of her back and possible need kyphoplasty.  Subjective: Patient was seen and examined this morning. Pleasant middle-aged Caucasian female.  Lying down in bed.  Not in distress at rest. Husband at bedside. Vitals stable.  Blood work this morning showed hemoglobin at 7.1. MRI thoracic spine showed an acute compression fracture of T11 without cord compression.  Assessment/Plan: Acute T11 compression fracture -Secondary to a fall. -Pending kyphoplasty by IR. I sent a text to Dr. Corliss Skains this morning. -Pain management. -PT eval postprocedure.  Severe acute blood loss anemia -Likely secondary to GI bleeding related to long-term use of NSAIDs. -Presented with a low hemoglobin 3.7.  3 units PRBCs given.  7.1 today. -Monitor hemoglobin. Transfuse if less than 7. -9/24, EGD showed mild gastritis. Continue PPI. May need colonoscopy in capsule endoscopy as an outpatient. -Obtain iron studies, vitamin B12 and folate  level. Recent Labs    08/21/20 2232 08/21/20 2232 08/22/20 0525 08/22/20 0525 08/23/20 0705 08/23/20 0705 08/24/20 0714 08/25/20 0458  HGB 3.7*  --  7.5*  --  7.3*  --  7.4* 7.1*  MCV 74.2*   < > 80.5   < > 80.9   < > 80.9 79.6*   < > = values in this interval not displayed.   Acute toxic metabolic encephalopathy -Presented with altered mental status, likely secondary to muscle relaxant and opioids  -MRI studies negative for CVA -2D echocardiogram with no acute findings  GERD -Continue PPI  Mobility: PT evaluation Code Status:   Code Status: Full Code  Nutritional status: Body mass index is 32.7 kg/m.     Diet Order            Diet NPO time specified Except for: Sips with Meds  Diet effective midnight           Diet regular Room service appropriate? Yes; Fluid consistency: Thin  Diet effective now                 DVT prophylaxis: SCDs Start: 08/22/20 0155   Antimicrobials:  None Fluid: None Consultants: Radiology Family Communication:  Husband at bedside  Status is: Inpatient  Remains inpatient appropriate because:Ongoing diagnostic testing needed not appropriate for outpatient work up   Dispo: The patient is from: Home              Anticipated d/c is to: Home  Anticipated d/c date is: 2 days              Patient currently is not medically stable to d/c.       Infusions:    Scheduled Meds: .  stroke: mapping our early stages of recovery book   Does not apply Once  . acetaminophen  650 mg Oral Q6H  . lidocaine  1 patch Transdermal Q24H  . pantoprazole  40 mg Oral Daily    Antimicrobials: Anti-infectives (From admission, onward)   None      PRN meds: HYDROmorphone (DILAUDID) injection, LORazepam, ondansetron (ZOFRAN) IV   Objective: Vitals:   08/25/20 0732 08/25/20 1257  BP: 139/68 105/63  Pulse: 72 82  Resp:    Temp: 99 F (37.2 C) 98 F (36.7 C)  SpO2: 100% 98%    Intake/Output Summary (Last 24 hours) at  08/25/2020 1407 Last data filed at 08/25/2020 1300 Gross per 24 hour  Intake 1080 ml  Output --  Net 1080 ml   Filed Weights   08/21/20 2104 08/22/20 2020 08/23/20 1302  Weight: 78.5 kg 81.1 kg 81.1 kg   Weight change:  Body mass index is 32.7 kg/m.   Physical Exam: General exam: Appears calm and comfortable. Not in distress at rest Skin: No rashes, lesions or ulcers. HEENT: Atraumatic, normocephalic, supple neck, no obvious bleeding Lungs: Clear to auscultation bilaterally CVS: Regular rate and rhythm, no murmur GI/Abd soft, nontender, nondistended, bowel sound present CNS: Alert, awake, oriented x3 Psychiatry: Mood appropriate Extremities: No pedal edema, no calf tenderness  Data Review: I have personally reviewed the laboratory data and studies available.  Recent Labs  Lab 08/21/20 2135 08/21/20 2135 08/21/20 2232 08/22/20 0525 08/23/20 0705 08/24/20 0714 08/25/20 0458  WBC 7.9   < > 8.0 9.2 6.1 5.7 5.5  NEUTROABS 7.0  --  6.9  --   --   --   --   HGB 3.7*   < > 3.7* 7.5* 7.3* 7.4* 7.1*  HCT 15.3*   < > 16.1* 26.8* 25.9* 26.3* 25.8*  MCV 72.2*   < > 74.2* 80.5 80.9 80.9 79.6*  PLT 363   < > PLATELET CLUMPS NOTED ON SMEAR, UNABLE TO ESTIMATE 339 326 303 317   < > = values in this interval not displayed.   Recent Labs  Lab 08/21/20 2135 08/23/20 0705 08/24/20 0714  NA 138 139 140  K 3.5 3.6 3.6  CL 108 108 104  CO2 19* 22 25  GLUCOSE 139* 91 93  BUN 17 11 10   CREATININE 0.59 0.50 0.51  CALCIUM 8.8* 8.4* 8.6*  MG  --  1.9  --    F/u labs ordered  Signed, , MD [facility] 08/25/2020

## 2020-08-26 ENCOUNTER — Encounter (HOSPITAL_COMMUNITY): Payer: Self-pay | Admitting: Internal Medicine

## 2020-08-26 LAB — BASIC METABOLIC PANEL
Anion gap: 7 (ref 5–15)
BUN: 8 mg/dL (ref 6–20)
CO2: 26 mmol/L (ref 22–32)
Calcium: 9.2 mg/dL (ref 8.9–10.3)
Chloride: 110 mmol/L (ref 98–111)
Creatinine, Ser: 0.58 mg/dL (ref 0.44–1.00)
GFR calc Af Amer: >60 mL/min (ref >60)
GFR calc non Af Amer: >60 mL/min (ref >60)
Glucose, Bld: 102 mg/dL — ABNORMAL HIGH (ref 70–99)
Potassium: 4.1 mmol/L (ref 3.5–5.1)
Sodium: 143 mmol/L (ref 135–145)

## 2020-08-26 LAB — CBC WITH DIFFERENTIAL/PLATELET
Abs Immature Granulocytes: 0.02 10*3/uL (ref 0.00–0.07)
Basophils Absolute: 0 10*3/uL (ref 0.0–0.1)
Basophils Relative: 1 %
Eosinophils Absolute: 0.2 10*3/uL (ref 0.0–0.5)
Eosinophils Relative: 3 %
HCT: 28.3 % — ABNORMAL LOW (ref 36.0–46.0)
Hemoglobin: 7.9 g/dL — ABNORMAL LOW (ref 12.0–15.0)
Immature Granulocytes: 0 %
Lymphocytes Relative: 26 %
Lymphs Abs: 1.5 10*3/uL (ref 0.7–4.0)
MCH: 22.4 pg — ABNORMAL LOW (ref 26.0–34.0)
MCHC: 27.9 g/dL — ABNORMAL LOW (ref 30.0–36.0)
MCV: 80.4 fL (ref 80.0–100.0)
Monocytes Absolute: 0.5 10*3/uL (ref 0.1–1.0)
Monocytes Relative: 9 %
Neutro Abs: 3.5 10*3/uL (ref 1.7–7.7)
Neutrophils Relative %: 61 %
Platelets: 360 10*3/uL (ref 150–400)
RBC: 3.52 MIL/uL — ABNORMAL LOW (ref 3.87–5.11)
RDW: 23.9 % — ABNORMAL HIGH (ref 11.5–15.5)
WBC: 5.7 10*3/uL (ref 4.0–10.5)
nRBC: 0.4 % — ABNORMAL HIGH (ref 0.0–0.2)

## 2020-08-26 LAB — IRON AND TIBC
Iron: 19 ug/dL — ABNORMAL LOW (ref 28–170)
Saturation Ratios: 4 % — ABNORMAL LOW (ref 10.4–31.8)
TIBC: 522 ug/dL — ABNORMAL HIGH (ref 250–450)
UIBC: 503 ug/dL

## 2020-08-26 LAB — FOLATE: Folate: 26.7 ng/mL (ref >5.9)

## 2020-08-26 LAB — TRANSFERRIN: Transferrin: 373 mg/dL (ref 192–382)

## 2020-08-26 LAB — FERRITIN: Ferritin: 7 ng/mL — ABNORMAL LOW (ref 11–307)

## 2020-08-26 LAB — VITAMIN B12: Vitamin B-12: 948 pg/mL — ABNORMAL HIGH (ref 180–914)

## 2020-08-26 LAB — MRSA PCR SCREENING: MRSA by PCR: POSITIVE — AB

## 2020-08-26 MED ORDER — CEFAZOLIN SODIUM-DEXTROSE 2-4 GM/100ML-% IV SOLN
2.0000 g | INTRAVENOUS | Status: AC
  Filled 2020-08-26: qty 100

## 2020-08-26 MED ORDER — MUPIROCIN 2 % EX OINT
1.0000 "application " | TOPICAL_OINTMENT | Freq: Two times a day (BID) | CUTANEOUS | Status: DC
  Administered 2020-08-26 – 2020-08-28 (×6): 1 via NASAL
  Filled 2020-08-26: qty 22

## 2020-08-26 MED ORDER — SODIUM CHLORIDE 0.9 % IV SOLN
510.0000 mg | Freq: Once | INTRAVENOUS | Status: AC
  Administered 2020-08-26: 510 mg via INTRAVENOUS
  Filled 2020-08-26: qty 17

## 2020-08-26 MED ORDER — CHLORHEXIDINE GLUCONATE CLOTH 2 % EX PADS
6.0000 | MEDICATED_PAD | Freq: Every day | CUTANEOUS | Status: DC
  Administered 2020-08-26 – 2020-08-27 (×2): 6 via TOPICAL

## 2020-08-26 NOTE — Progress Notes (Signed)
PROGRESS NOTE  Kylie Wells  DOB: [birth year]  PCP: Aviva Kluver TKZ:[id number]  DOA: 08/21/2020  LOS: 4 days   Chief Complaint  Patient presents with  . Altered Mental Status    Brief narrative: Kylie Wells is a 54 y.o. female with PMH of GERD, TBI in 2018 and chronic left foot pain on Advil She was brought to the ED at [facility] on 9/22 for altered mental status.  Evidently she had a fall few days ago, sustained excruciating lower back pain and took more Advil and muscle relaxants.  9/22, she went to an urgent care sent and was started on prednisone taper.  She became more confused and was hence brought to the ED.  In the ED, her hemoglobin was low at 3.7.  She was given 3 units of PRBC. She was admitted to hospitalist service. MRI brain was obtained for altered mental status which did not show any evidence of a stroke. 9/24, she underwent EGD that showed mild gastritis only.  Her hemoglobin level is stabilized 9/25, she was transferred to [facility] for MRI of her back and possible need kyphoplasty.  Subjective: Patient was seen and examined this morning. Lying on bed.  Not in distress.  Husband at bedside.  Hemoglobin improved 7.9. Radiology plans to do kyphoplasty tomorrow.  Assessment/Plan: Acute T11 compression fracture -Secondary to a fall. -Pending kyphoplasty by IR tomorrow -Continue pain management. -PT eval postprocedure.  Severe acute blood loss anemia Severe iron deficiency anemia Likely chronic GI bleeding. -Patient has been on Motrin for a long time.  She probably was having chronic GI bleeding.  She did not have symptoms related to anemia at presentation however her hemoglobin was low at 3.7. -3 units PRBCs given.  7.9 today. -9/24, EGD showed mild gastritis. Continue PPI. May need colonoscopy in capsule endoscopy as an outpatient. -Anemia studies with ferritin low at 7.  1 unit of Feraheme to be given today. Recent Labs    08/22/20 0525  08/22/20 0525 08/23/20 0705 08/23/20 0705 08/24/20 0714 08/24/20 0714 08/25/20 0458 08/26/20 0309  HGB 7.5*  --  7.3*  --  7.4*  --  7.1* 7.9*  MCV 80.5   < > 80.9   < > 80.9   < > 79.6* 80.4  VITAMINB12  --   --   --   --   --   --   --  948*  FOLATE  --   --   --   --   --   --   --  26.7  FERRITIN  --   --   --   --   --   --   --  7*  TIBC  --   --   --   --   --   --   --  522*  IRON  --   --   --   --   --   --   --  19*   < > = values in this interval not displayed.   Acute toxic metabolic encephalopathy -Presented with altered mental status, likely secondary to muscle relaxant and opioids  -MRI studies negative for CVA -2D echocardiogram with no acute findings  GERD -Continue PPI  Mobility: PT evaluation Code Status:   Code Status: Full Code  Nutritional status: Body mass index is 32.7 kg/m.     Diet Order            Diet NPO time specified Except for:  Sips with Meds  Diet effective midnight                 DVT prophylaxis: SCDs Start: 08/22/20 0155   Antimicrobials:  None Fluid: None Consultants: Radiology Family Communication:  Husband at bedside  Status is: Inpatient  Remains inpatient appropriate because: Kyphoplasty tomorrow  Dispo: The patient is from: Home              Anticipated d/c is to: Home              Anticipated d/c date is: 2 days              Patient currently is not medically stable to d/c.       Infusions:  . ferumoxytol      Scheduled Meds: .  stroke: mapping our early stages of recovery book   Does not apply Once  . acetaminophen  650 mg Oral Q6H  . Chlorhexidine Gluconate Cloth  6 each Topical Q0600  . lidocaine  1 patch Transdermal Q24H  . mupirocin ointment  1 application Nasal BID  . pantoprazole  40 mg Oral Daily    Antimicrobials: Anti-infectives (From admission, onward)   None      PRN meds: HYDROmorphone (DILAUDID) injection, LORazepam, ondansetron (ZOFRAN) IV, polyethylene glycol    Objective: Vitals:   08/26/20 0730 08/26/20 1131  BP: 129/61 119/68  Pulse: 63 64  Resp: 18   Temp: 98.1 F (36.7 C) 98.1 F (36.7 C)  SpO2: 99% 98%   No intake or output data in the 24 hours ending 08/26/20 1437 Filed Weights   08/21/20 2104 08/22/20 2020 08/23/20 1302  Weight: 78.5 kg 81.1 kg 81.1 kg   Weight change:  Body mass index is 32.7 kg/m.   Physical Exam: General exam: Appears calm and comfortable. Not in distress at rest Skin: No rashes, lesions or ulcers. HEENT: Atraumatic, normocephalic, supple neck, no obvious bleeding Lungs: Clear to auscultation bilaterally CVS: Regular rate and rhythm, no murmur GI/Abd soft, nontender, nondistended, bowel sound present CNS: Alert, awake, oriented x3 Psychiatry: Mood appropriate Extremities: No pedal edema, no calf tenderness  Data Review: I have personally reviewed the laboratory data and studies available.  Recent Labs  Lab 08/21/20 2135 08/21/20 2135 08/21/20 2232 08/21/20 2232 08/22/20 0525 08/23/20 0705 08/24/20 0714 08/25/20 0458 08/26/20 0309  WBC 7.9   < > 8.0   < > 9.2 6.1 5.7 5.5 5.7  NEUTROABS 7.0  --  6.9  --   --   --   --   --  3.5  HGB 3.7*   < > 3.7*   < > 7.5* 7.3* 7.4* 7.1* 7.9*  HCT 15.3*   < > 16.1*   < > 26.8* 25.9* 26.3* 25.8* 28.3*  MCV 72.2*   < > 74.2*   < > 80.5 80.9 80.9 79.6* 80.4  PLT 363   < > PLATELET CLUMPS NOTED ON SMEAR, UNABLE TO ESTIMATE   < > 339 326 303 317 360   < > = values in this interval not displayed.   Recent Labs  Lab 08/21/20 2135 08/23/20 0705 08/24/20 0714 08/26/20 0309  NA 138 139 140 143  K 3.5 3.6 3.6 4.1  CL 108 108 104 110  CO2 19* 22 25 26   GLUCOSE 139* 91 93 102*  BUN 17 11 10 8   CREATININE 0.59 0.50 0.51 0.58  CALCIUM 8.8* 8.4* 8.6* 9.2  MG  --  1.9  --   --  F/u labs ordered  Signed, Lorin Glass, MD [facility] 08/26/2020

## 2020-08-26 NOTE — Progress Notes (Signed)
Physical Therapy Treatment Patient Details Name: Kylie Wells MRN: [id number] DOB: 03-04-1966 Today's Date: 08/26/2020    History of Present Illness Kylie Wells is a 54 y.o. female with medical history significant of GERD, TBI in 2018 who was brought to the emergency department via EMS due to a possible occult stroke.  Her husband stated that he last saw her normal around 1430 earlier today, she took hydrocodone and a muscle relaxant earlier and went to sleep.  When she woke up about 4 hours later, she was very confused.  EMS stated that she was altered on their initial assessment.  The patient has been taking analgesics and muscle relaxants after having a fall over the weekend and diagnosed with musculoskeletal pain on her back.  Her husband states that she has been taking a lot of ibuprofen until recently due to chronic left foot pain.  She was scheduled to have surgery on her left foot to try to correct this issue, but this was postponed due to the COVID-19 delta variant healthcare crisis.  They deny vaginal bleed, melena or hematemesis.  The husband stated that she has been sleeping more than usual.  He also noticed that her systolic BP was in the low 100s when she was at the urgent care, which is unusual for her.  No fever, chills, headache, sore throat, rhinorrhea, wheezing or hemoptysis.  No chest pain, palpitations, diaphoresis, PND, orthopnea or recent pitting edema of the lower extremities.  No abdominal pain, diarrhea, constipation, melena or hematochezia.  No dysuria, frequency or hematuria.  Denies polyuria, polydipsia, polyphagia or blurred vision.    PT Comments    Pt seen after transfer to [facility] from [facility]. Focus of session was education on back precautions for comfort as well as in preparation for kyphoplasty (possibly today?). Pt's husband was present and supportive throughout. I anticipate pt will progress well after procedure and will not require any PT  follow-up immediately at d/c. Will continue to follow and progress as able per POC.    Follow Up Recommendations  No PT follow up;Supervision for mobility/OOB     Equipment Recommendations  None recommended by PT    Recommendations for Other Services       Precautions / Restrictions Precautions Precautions: Fall;Back Precaution Booklet Issued: Yes (comment) Precaution Comments: Back precautions for comfort and in preparation for kyphoplasty.  Restrictions Weight Bearing Restrictions: No    Mobility  Bed Mobility Overal bed mobility: Modified Independent Bed Mobility: Rolling;Sidelying to Sit Rolling: Modified independent (Device/Increase time) Sidelying to sit: Modified independent (Device/Increase time)       General bed mobility comments: VC's for optimal log roll technique. Pt did not recall education from PT evaluation.   Transfers Overall transfer level: Needs assistance Equipment used: Rolling walker (2 wheeled) Transfers: Sit to/from [employer] Sit to Stand: Min guard         General transfer comment: VC's for improved posture and hand placement on seated surface for safety. No assist required however close guard provided for safety.  Ambulation/Gait Ambulation/Gait assistance: Supervision;Min guard Gait Distance (Feet): 200 Feet Assistive device: Rolling walker (2 wheeled) Gait Pattern/deviations: Decreased stride length;Wide base of support;Step-through pattern;Decreased step length - left;Antalgic;Decreased weight shift to left Gait velocity: decreased Gait velocity interpretation: <1.8 ft/sec, indicate of risk for recurrent falls General Gait Details: Min guard progressing to supervision for safety with RW for support. Pt with mildly antalgic gait pattern (baseline) and L circumduction and effortful advancement of LLE  noted.    Stairs             Wheelchair Mobility    Modified Rankin (Stroke Patients Only)       Balance  Overall balance assessment: Needs assistance Sitting-balance support: Feet supported;No upper extremity supported Sitting balance-Leahy Scale: Good Sitting balance - Comments: seated at EOB   Standing balance support: During functional activity;No upper extremity supported Standing balance-Leahy Scale: Poor Standing balance comment: fair/poor without AD, fair/good using RW                            Cognition Arousal/Alertness: Awake/alert Behavior During Therapy: WFL for tasks assessed/performed Overall Cognitive Status: Impaired/Different from baseline Area of Impairment: Memory;Safety/judgement                     Memory: Decreased short-term memory;Decreased recall of precautions   Safety/Judgement: Decreased awareness of safety;Decreased awareness of deficits     General Comments: History of TBI. Husband assists her with memory accuracy.       Exercises      General Comments        Pertinent Vitals/Pain Pain Assessment: 0-10 Faces Pain Scale: Hurts little more Pain Location: low back Pain Descriptors / Indicators: Sore;Aching Pain Intervention(s): Limited activity within patient's tolerance;Monitored during session;Repositioned    Home Living                      Prior Function            PT Goals (current goals can now be found in the care plan section) Acute Rehab PT Goals Patient Stated Goal: return home with family to assist PT Goal Formulation: With patient/family Time For Goal Achievement: 09/02/20 Potential to Achieve Goals: Good Progress towards PT goals: Progressing toward goals    Frequency    Min 3X/week      PT Plan Current plan remains appropriate    Co-evaluation              AM-PAC PT "6 Clicks" Mobility   Outcome Measure  Help needed turning from your back to your side while in a flat bed without using bedrails?: None Help needed moving from lying on your back to sitting on the side of a flat  bed without using bedrails?: None Help needed moving to and from a bed to a chair (including a wheelchair)?: A Little Help needed standing up from a chair using your arms (e.g., wheelchair or bedside chair)?: A Little Help needed to walk in hospital room?: A Little Help needed climbing 3-5 steps with a railing? : A Little 6 Click Score: 20    End of Session Equipment Utilized During Treatment: Gait belt Activity Tolerance: Patient tolerated treatment well Patient left: in chair;with call bell/phone within reach;with family/visitor present Nurse Communication: Mobility status PT Visit Diagnosis: Unsteadiness on feet (R26.81);Other abnormalities of gait and mobility (R26.89);Muscle weakness (generalized) (M62.81)     Time: 1610-9604 PT Time Calculation (min) (ACUTE ONLY): 20 min  Charges:  $Gait Training: 8-22 mins                     Conni Slipper, PT, DPT Acute Rehabilitation Services Pager: [phone] Office: [phone]    Marylynn Pearson 08/26/2020, 2:33 PM

## 2020-08-26 NOTE — Progress Notes (Signed)
Chief Complaint: Patient was seen in consultation today for T11 vertebral fracture  Referring Physician(s): Dr. Lorin Glass  Supervising Physician: Julieanne Cotton  Patient Status: [facility] - In-pt  History of Present Illness: Kylie Wells is a 54 y.o. female admitted with back pain after fall. Found to have acute T11 compression fracture.  Reports lower thoracic back pain 4/10 at rest, up to 8/10 with movements. Pain is temporarily partially relieved with narcotic pain meds. She denies LE pain or paresthesia. Denies bowel or bladder dysfunction. PMHx, meds, labs, imaging, allergies reviewed. Dr. Corliss Skains has reviewed imaging    Past Medical History:  Diagnosis Date  . Class 1 obesity 08/22/2020  . GERD (gastroesophageal reflux disease)   . TBI (traumatic brain injury) (HCC) 08/21/2020    Past Surgical History:  Procedure Laterality Date  . BREAST SURGERY    . CHOLECYSTECTOMY    . COLONOSCOPY  2019   [place]: one polyp, 19mm removed. path unavailable.  Marland Kitchen FOOT SURGERY Left   . humerus Right    traumatic fracture  . NECK SURGERY      Allergies: Shellfish allergy and Sulfa antibiotics  Medications:  Current Facility-Administered Medications:  .   stroke: mapping our early stages of recovery book, , Does not apply, Once, Bobette Mo, MD .  acetaminophen (TYLENOL) tablet 650 mg, 650 mg, Oral, Q6H, Shah, Pratik D, DO, 650 mg at 08/26/20 4742 .  Chlorhexidine Gluconate Cloth 2 % PADS 6 each, 6 each, Topical, Q0600, Lorin Glass, MD, 6 each at 08/26/20 0541 .  HYDROmorphone (DILAUDID) injection 1 mg, 1 mg, Intravenous, Q4H PRN, Bobette Mo, MD, 1 mg at 08/26/20 0929 .  lidocaine (LIDODERM) 5 % 1 patch, 1 patch, Transdermal, Q24H, Shah, Pratik D, DO, 1 patch at 08/25/20 1533 .  LORazepam (ATIVAN) injection 0.5 mg, 0.5 mg, Intravenous, Once PRN, Bobette Mo, MD .  mupirocin ointment (BACTROBAN) 2 % 1 application, 1 application, Nasal, BID,  Dahal, Melina Schools, MD, 1 application at 08/26/20 0933 .  ondansetron [facility]) injection 4 mg, 4 mg, Intravenous, Q6H PRN, Bobette Mo, MD, 4 mg at 08/22/20 0457 .  pantoprazole (PROTONIX) EC tablet 40 mg, 40 mg, Oral, Daily, Sherryll Burger, Pratik D, DO, 40 mg at 08/26/20 0928 .  polyethylene glycol (MIRALAX / GLYCOLAX) packet 17 g, 17 g, Oral, Daily PRN, Dahal, Melina Schools, MD, 17 g at 08/25/20 1901    Family History  Problem Relation Age of Onset  . Cancer Mother        lung  . Hypertension Mother   . Hyperlipidemia Sister     Social History   Socioeconomic History  . Marital status: Married    Spouse name: Not on file  . Number of children: Not on file  . Years of education: Not on file  . Highest education level: Not on file  Occupational History  . Occupation: nurse prior to [employer]  Tobacco Use  . Smoking status: Never Smoker  . Smokeless tobacco: Never Used  Substance and Sexual Activity  . Alcohol use: Yes  . Drug use: No  . Sexual activity: Not on file  Other Topics Concern  . Not on file  Social History Narrative  . Not on file   Social Determinants of Health   Financial Resource Strain:   . Difficulty of Paying Living Expenses: Not on file  Food Insecurity:   . Worried About Programme researcher, broadcasting/film/video in the Last Year: Not on file  . Ran Out of Food  in the Last Year: Not on file  Transportation Needs:   . Lack of Transportation (Medical): Not on file  . Lack of Transportation (Non-Medical): Not on file  Physical Activity:   . Days of Exercise per Week: Not on file  . Minutes of Exercise per Session: Not on file  Stress:   . Feeling of Stress : Not on file  Social Connections:   . Frequency of Communication with Friends and Family: Not on file  . Frequency of Social Gatherings with Friends and Family: Not on file  . Attends Religious Services: Not on file  . Active Member of Clubs or Organizations: Not on file  . Attends Banker Meetings: Not on file  .  Marital Status: Not on file    Review of Systems: A 12 point ROS discussed and pertinent positives are indicated in the HPI above.  All other systems are negative.  Review of Systems  Vital Signs: BP 119/68 (BP Location: Right Arm)   Pulse 64   Temp 98.1 F (36.7 C) (Oral)   Resp 18   Ht 5\' 2"  (1.575 m)   Wt 81.1 kg   LMP  (LMP Unknown)   SpO2 98%   BMI 32.70 kg/m   Physical Exam Constitutional:      General: She is not in acute distress.    Appearance: She is not ill-appearing.  HENT:     Mouth/Throat:     Mouth: Mucous membranes are moist.     Pharynx: Oropharynx is clear.  Cardiovascular:     Rate and Rhythm: Normal rate and regular rhythm.     Heart sounds: Normal heart sounds.  Pulmonary:     Effort: Pulmonary effort is normal. No respiratory distress.     Breath sounds: Normal breath sounds.  Abdominal:     General: Abdomen is flat. There is no distension.     Palpations: Abdomen is soft.     Tenderness: There is no abdominal tenderness.  Musculoskeletal:     Comments: Tenderness about the lower thoracic spinous processes  Skin:    General: Skin is warm and dry.  Neurological:     General: No focal deficit present.     Mental Status: She is alert.  Psychiatric:        Mood and Affect: Mood normal.        Thought Content: Thought content normal.       Imaging: CT ABDOMEN PELVIS WO CONTRAST  Result Date: 08/21/2020 CLINICAL DATA:  Abdominal trauma, code stroke. Fell 3 days ago. Patient brought in to ED confused. EXAM: CT CHEST, ABDOMEN AND PELVIS WITHOUT CONTRAST TECHNIQUE: Multidetector CT imaging of the chest, abdomen and pelvis was performed following the standard protocol without IV contrast. COMPARISON:  X-ray abdomen 08/10/2017, chest x-ray 08/12/2017. FINDINGS: CT CHEST FINDINGS Cardiovascular: No significant vascular findings. Normal heart size. No pericardial effusion. Hyperdense myocardium compared to the cardiac chambers suggestive of anemia. The  thoracic aorta is normal in caliber. Mild atherosclerotic plaque. No surrounding fat stranding of the aorta. Mediastinum/Nodes: No enlarged mediastinal, hilar, or axillary lymph nodes. Thyroid gland, trachea, and esophagus demonstrate no significant findings. Moderate volume hiatal hernia. No bowel wall thickening or pneumatosis of the stomach. Lungs/Pleura: Expriatory phase of respiration. Few scattered pulmonary micro nodules. No pulmonary mass. No focal consolidation. No pleural effusion or pneumothorax. Musculoskeletal: No chest wall abnormality No suspicious lytic or blastic osseous lesions. Multiple, old healed, nondisplaced rib fractures bilaterally. No acute displaced rib fracture. No acute  displaced sternal fracture. Multilevel degenerative changes of the spine. Age-indeterminate compression fracture of the T11 vertebral body with greater than 40% height loss. CT ABDOMEN PELVIS FINDINGS Hepatobiliary: No focal liver abnormality is seen. Status post cholecystectomy. No biliary dilatation. Pancreas: Unremarkable. No pancreatic ductal dilatation or surrounding inflammatory changes. Spleen: Normal in size without focal abnormality. Adrenals/Urinary Tract: No adrenal nodule bilaterally. No nephrolithiasis, no hydronephrosis, and no contour-deforming renal mass. No ureterolithiasis or hydroureter. The urinary bladder is unremarkable. Stomach/Bowel: Stomach is within normal limits. Appendix appears normal in caliber with and a punctate appendicolith at its tip. No evidence of bowel wall thickening, distention, or inflammatory changes. The cecum is noted to be medialized. Vascular/Lymphatic: No significant vascular findings are present. No fat stranding surrounding the aorta. Mild atherosclerotic plaque. No enlarged abdominal or pelvic lymph nodes. Reproductive: Uterus and bilateral adnexa are unremarkable. Other: No abdominal wall hernia or abnormality. No abdominopelvic ascites. Musculoskeletal: No abdominal wall  hernia or abnormality. Fatty degeneration of the left tensor fascia lata muscle. Sclerotic lesion with rings and arcs morphology of the right S1 level (7:105, 3:89) as well as a nodular lesion that a similar within the left iliac bone (7:101). These lesions were previously identified on x-ray abdomen 08/10/2017 and therefore may represent bone island versus enchondromas. Otherwise no suspicious or new lytic or blastic osseous lesions. Multilevel degenerative changes of the spine. IMPRESSION: 1. No acute intrathoracic or intra-abdominal/intrapelvic traumatic injury on this noncontrast study. 2. Age-indeterminate, possibly acute, T11 vertebral body fracture with greater than 40% height loss. Correlate with tenderness to palpation. 3. Moderate-sized hiatal hernia. 4.  Aortic Atherosclerosis (ICD10-I70.0). Electronically Signed   By: Tish Frederickson M.D.   On: 08/21/2020 22:04   CT Chest Wo Contrast  Result Date: 08/21/2020 CLINICAL DATA:  Abdominal trauma, code stroke. Fell 3 days ago. Patient brought in to ED confused. EXAM: CT CHEST, ABDOMEN AND PELVIS WITHOUT CONTRAST TECHNIQUE: Multidetector CT imaging of the chest, abdomen and pelvis was performed following the standard protocol without IV contrast. COMPARISON:  X-ray abdomen 08/10/2017, chest x-ray 08/12/2017. FINDINGS: CT CHEST FINDINGS Cardiovascular: No significant vascular findings. Normal heart size. No pericardial effusion. Hyperdense myocardium compared to the cardiac chambers suggestive of anemia. The thoracic aorta is normal in caliber. Mild atherosclerotic plaque. No surrounding fat stranding of the aorta. Mediastinum/Nodes: No enlarged mediastinal, hilar, or axillary lymph nodes. Thyroid gland, trachea, and esophagus demonstrate no significant findings. Moderate volume hiatal hernia. No bowel wall thickening or pneumatosis of the stomach. Lungs/Pleura: Expriatory phase of respiration. Few scattered pulmonary micro nodules. No pulmonary mass. No  focal consolidation. No pleural effusion or pneumothorax. Musculoskeletal: No chest wall abnormality No suspicious lytic or blastic osseous lesions. Multiple, old healed, nondisplaced rib fractures bilaterally. No acute displaced rib fracture. No acute displaced sternal fracture. Multilevel degenerative changes of the spine. Age-indeterminate compression fracture of the T11 vertebral body with greater than 40% height loss. CT ABDOMEN PELVIS FINDINGS Hepatobiliary: No focal liver abnormality is seen. Status post cholecystectomy. No biliary dilatation. Pancreas: Unremarkable. No pancreatic ductal dilatation or surrounding inflammatory changes. Spleen: Normal in size without focal abnormality. Adrenals/Urinary Tract: No adrenal nodule bilaterally. No nephrolithiasis, no hydronephrosis, and no contour-deforming renal mass. No ureterolithiasis or hydroureter. The urinary bladder is unremarkable. Stomach/Bowel: Stomach is within normal limits. Appendix appears normal in caliber with and a punctate appendicolith at its tip. No evidence of bowel wall thickening, distention, or inflammatory changes. The cecum is noted to be medialized. Vascular/Lymphatic: No significant vascular findings are present. No fat  stranding surrounding the aorta. Mild atherosclerotic plaque. No enlarged abdominal or pelvic lymph nodes. Reproductive: Uterus and bilateral adnexa are unremarkable. Other: No abdominal wall hernia or abnormality. No abdominopelvic ascites. Musculoskeletal: No abdominal wall hernia or abnormality. Fatty degeneration of the left tensor fascia lata muscle. Sclerotic lesion with rings and arcs morphology of the right S1 level (7:105, 3:89) as well as a nodular lesion that a similar within the left iliac bone (7:101). These lesions were previously identified on x-ray abdomen 08/10/2017 and therefore may represent bone island versus enchondromas. Otherwise no suspicious or new lytic or blastic osseous lesions. Multilevel  degenerative changes of the spine. IMPRESSION: 1. No acute intrathoracic or intra-abdominal/intrapelvic traumatic injury on this noncontrast study. 2. Age-indeterminate, possibly acute, T11 vertebral body fracture with greater than 40% height loss. Correlate with tenderness to palpation. 3. Moderate-sized hiatal hernia. 4.  Aortic Atherosclerosis (ICD10-I70.0). Electronically Signed   By: Tish FredericksonMorgane  Naveau M.D.   On: 08/21/2020 22:04   MR ANGIO HEAD WO CONTRAST  Result Date: 08/22/2020 CLINICAL DATA:  Ataxia, neuro deficit, acute. EXAM: MRI HEAD WITHOUT CONTRAST MRA HEAD WITHOUT CONTRAST MRA NECK WITHOUT AND WITH CONTRAST TECHNIQUE: Multiplanar, multiecho pulse sequences of the brain and surrounding structures were obtained without intravenous contrast. Angiographic images of the Circle of Willis were obtained using MRA technique without intravenous contrast. Angiographic images of the neck were obtained using MRA technique without and with intravenous contrast. Carotid stenosis measurements (when applicable) are obtained utilizing NASCET criteria, using the distal internal carotid diameter as the denominator. CONTRAST:  7mL GADAVIST GADOBUTROL 1 MMOL/ML IV SOLN COMPARISON:  08/21/2020 CT head code stroke. FINDINGS: MRI HEAD FINDINGS Brain: No diffusion-weighted signal abnormality. Sequela of prior bilateral frontal and left temporal insults with superficial siderosis. No acute intracranial hemorrhage. Mild cerebral atrophy with ex vacuo dilatation. Minimal chronic microvascular ischemic changes. No midline shift, ventriculomegaly or extra-axial fluid collection. No mass lesion. Vascular: Please see MRA head. Skull and upper cervical spine: Normal marrow signal. Sinuses/Orbits: Normal orbits. Tiny right maxillary sinus mucous retention cyst. Trace right mastoid effusion. Other: None. MRA HEAD FINDINGS Anterior circulation: Left A1 segment hypoplasia. The bilateral Pcomms are either hypoplastic or absent. No  significant stenosis, proximal occlusion, aneurysm, or vascular malformation. Posterior circulation: Dominant right vertebral artery. Dominant right AICA. No significant stenosis, proximal occlusion, aneurysm, or vascular malformation. Venous sinuses: No evidence of thrombosis. Anatomic variants: As detailed above. MRA NECK FINDINGS There is no high-grade narrowing or focal aneurysm involving the bilateral carotid arteries. No evidence of dissection. Retropharyngeal right bifurcation and right common carotid artery. Retropharyngeal bilateral proximal ECAs and ICAs. The bilateral vertebral arteries are patent and demonstrate antegrade flow. Dominant right vertebral artery. No evidence of high-grade narrowing or focal aneurysm. Three vessel aortic arch. IMPRESSION: No acute intracranial process. Sequela of remote bilateral frontal and left temporal insults. Left frontal superficial siderosis. No evidence of occlusion, high-grade narrowing, dissection or aneurysm involving the major intracranial or neck vessels. Electronically Signed   By: Stana Buntinghikanele  Emekauwa M.D.   On: 08/22/2020 10:11   MR ANGIO NECK W WO CONTRAST  Result Date: 08/22/2020 CLINICAL DATA:  Ataxia, neuro deficit, acute. EXAM: MRI HEAD WITHOUT CONTRAST MRA HEAD WITHOUT CONTRAST MRA NECK WITHOUT AND WITH CONTRAST TECHNIQUE: Multiplanar, multiecho pulse sequences of the brain and surrounding structures were obtained without intravenous contrast. Angiographic images of the Circle of Willis were obtained using MRA technique without intravenous contrast. Angiographic images of the neck were obtained using MRA technique without and with intravenous contrast.  Carotid stenosis measurements (when applicable) are obtained utilizing NASCET criteria, using the distal internal carotid diameter as the denominator. CONTRAST:  25mL GADAVIST GADOBUTROL 1 MMOL/ML IV SOLN COMPARISON:  08/21/2020 CT head code stroke. FINDINGS: MRI HEAD FINDINGS Brain: No  diffusion-weighted signal abnormality. Sequela of prior bilateral frontal and left temporal insults with superficial siderosis. No acute intracranial hemorrhage. Mild cerebral atrophy with ex vacuo dilatation. Minimal chronic microvascular ischemic changes. No midline shift, ventriculomegaly or extra-axial fluid collection. No mass lesion. Vascular: Please see MRA head. Skull and upper cervical spine: Normal marrow signal. Sinuses/Orbits: Normal orbits. Tiny right maxillary sinus mucous retention cyst. Trace right mastoid effusion. Other: None. MRA HEAD FINDINGS Anterior circulation: Left A1 segment hypoplasia. The bilateral Pcomms are either hypoplastic or absent. No significant stenosis, proximal occlusion, aneurysm, or vascular malformation. Posterior circulation: Dominant right vertebral artery. Dominant right AICA. No significant stenosis, proximal occlusion, aneurysm, or vascular malformation. Venous sinuses: No evidence of thrombosis. Anatomic variants: As detailed above. MRA NECK FINDINGS There is no high-grade narrowing or focal aneurysm involving the bilateral carotid arteries. No evidence of dissection. Retropharyngeal right bifurcation and right common carotid artery. Retropharyngeal bilateral proximal ECAs and ICAs. The bilateral vertebral arteries are patent and demonstrate antegrade flow. Dominant right vertebral artery. No evidence of high-grade narrowing or focal aneurysm. Three vessel aortic arch. IMPRESSION: No acute intracranial process. Sequela of remote bilateral frontal and left temporal insults. Left frontal superficial siderosis. No evidence of occlusion, high-grade narrowing, dissection or aneurysm involving the major intracranial or neck vessels. Electronically Signed   By: Stana Bunting M.D.   On: 08/22/2020 10:11   MR BRAIN WO CONTRAST  Result Date: 08/22/2020 CLINICAL DATA:  Ataxia, neuro deficit, acute. EXAM: MRI HEAD WITHOUT CONTRAST MRA HEAD WITHOUT CONTRAST MRA NECK  WITHOUT AND WITH CONTRAST TECHNIQUE: Multiplanar, multiecho pulse sequences of the brain and surrounding structures were obtained without intravenous contrast. Angiographic images of the Circle of Willis were obtained using MRA technique without intravenous contrast. Angiographic images of the neck were obtained using MRA technique without and with intravenous contrast. Carotid stenosis measurements (when applicable) are obtained utilizing NASCET criteria, using the distal internal carotid diameter as the denominator. CONTRAST:  52mL GADAVIST GADOBUTROL 1 MMOL/ML IV SOLN COMPARISON:  08/21/2020 CT head code stroke. FINDINGS: MRI HEAD FINDINGS Brain: No diffusion-weighted signal abnormality. Sequela of prior bilateral frontal and left temporal insults with superficial siderosis. No acute intracranial hemorrhage. Mild cerebral atrophy with ex vacuo dilatation. Minimal chronic microvascular ischemic changes. No midline shift, ventriculomegaly or extra-axial fluid collection. No mass lesion. Vascular: Please see MRA head. Skull and upper cervical spine: Normal marrow signal. Sinuses/Orbits: Normal orbits. Tiny right maxillary sinus mucous retention cyst. Trace right mastoid effusion. Other: None. MRA HEAD FINDINGS Anterior circulation: Left A1 segment hypoplasia. The bilateral Pcomms are either hypoplastic or absent. No significant stenosis, proximal occlusion, aneurysm, or vascular malformation. Posterior circulation: Dominant right vertebral artery. Dominant right AICA. No significant stenosis, proximal occlusion, aneurysm, or vascular malformation. Venous sinuses: No evidence of thrombosis. Anatomic variants: As detailed above. MRA NECK FINDINGS There is no high-grade narrowing or focal aneurysm involving the bilateral carotid arteries. No evidence of dissection. Retropharyngeal right bifurcation and right common carotid artery. Retropharyngeal bilateral proximal ECAs and ICAs. The bilateral vertebral arteries are  patent and demonstrate antegrade flow. Dominant right vertebral artery. No evidence of high-grade narrowing or focal aneurysm. Three vessel aortic arch. IMPRESSION: No acute intracranial process. Sequela of remote bilateral frontal and left temporal insults. Left frontal superficial  siderosis. No evidence of occlusion, high-grade narrowing, dissection or aneurysm involving the major intracranial or neck vessels. Electronically Signed   By: Stana Bunting M.D.   On: 08/22/2020 10:11   MR THORACIC SPINE WO CONTRAST  Result Date: 08/25/2020 CLINICAL DATA:  T11 compression fracture EXAM: MRI THORACIC SPINE WITHOUT CONTRAST TECHNIQUE: Multiplanar, multisequence MR imaging of the thoracic spine was performed. No intravenous contrast was administered. COMPARISON:  Chest CT from 4 days ago FINDINGS: Alignment: No traumatic malalignment. C7-T1 anterolisthesis related to advanced facet osteoarthritis. Vertebrae: T11 compression fracture with diffuse marrow edema around a branching fracture plane. Mild paravertebral edema is present. Compared to T10 height loss measures 40%. There is retropulsion by 4 mm, without cord compression. No evidence of underlying bone lesion. Cord:  Normal signal.  No evidence of intrathecal collection Paraspinal and other soft tissues: Small layering pleural effusions. Paravertebral edema related to the fracture. Disc levels: Tiny disc protrusions seen at T6-7, T8-9, and T9-10. Negative facets. No neural impingement. IMPRESSION: Acute T11 compression fracture with 40% height loss and 3 mm of retropulsion. Retropulsed bone contacts the cord without compression. Electronically Signed   By: Marnee Spring M.D.   On: 08/25/2020 09:08   ECHOCARDIOGRAM COMPLETE  Result Date: 08/22/2020    ECHOCARDIOGRAM REPORT   Patient Name:   LENETTA PICHE Date of Exam: 08/22/2020 Medical Rec #:  [id number]    Height:       61.0 in Accession #:    [id number]   Weight:       173.1 lb Date of Birth:  [birth year]     BSA:          1.776 m Patient Age:    54 years     BP:           104/71 mmHg Patient Gender: F            HR:           75 bpm. [facility] Procedure: 2D Echo Indications:    TIA 435.9 / G45.9  History:        Patient has no prior history of Echocardiogram examinations.                 Risk Factors:Non-Smoker. Traumatic Brain Injury, Asymptomatic                 bacteriuria, Aphasia.  Sonographer:    Jeryl Columbia RDCS (AE) Referring Phys: [id number] DAVID MANUEL ORTIZ IMPRESSIONS  1. Left ventricular ejection fraction, by estimation, is 60 to 65%. The left ventricle has normal function. The left ventricle has no regional wall motion abnormalities. There is mild left ventricular hypertrophy. Left ventricular diastolic parameters were normal.  2. Right ventricular systolic function is normal. The right ventricular size is normal.  3. The mitral valve is normal in structure. Trivial mitral valve regurgitation. No evidence of mitral stenosis.  4. The aortic valve is tricuspid. Aortic valve regurgitation is not visualized. No aortic stenosis is present.  5. The inferior vena cava is normal in size with greater than 50% respiratory variability, suggesting right atrial pressure of 3 mmHg. FINDINGS  Left Ventricle: Left ventricular ejection fraction, by estimation, is 60 to 65%. The left ventricle has normal function. The left ventricle has no regional wall motion abnormalities. The left ventricular internal cavity size was normal in size. There is  mild left ventricular hypertrophy. Left ventricular diastolic parameters were normal. Right Ventricle: The right ventricular size is normal. No increase  in right ventricular wall thickness. Right ventricular systolic function is normal. Left Atrium: Left atrial size was normal in size. Right Atrium: Right atrial size was normal in size. Pericardium: There is no evidence of pericardial effusion. Mitral Valve: The mitral valve is normal in structure. Trivial  mitral valve regurgitation. No evidence of mitral valve stenosis. Tricuspid Valve: The tricuspid valve is normal in structure. Tricuspid valve regurgitation is not demonstrated. No evidence of tricuspid stenosis. Aortic Valve: The aortic valve is tricuspid. Aortic valve regurgitation is not visualized. No aortic stenosis is present. Aortic valve mean gradient measures 7.1 mmHg. Aortic valve peak gradient measures 12.8 mmHg. Aortic valve area, by VTI measures 1.79  cm. Pulmonic Valve: The pulmonic valve was not well visualized. Pulmonic valve regurgitation is trivial. No evidence of pulmonic stenosis. Aorta: The aortic root is normal in size and structure. Venous: The inferior vena cava is normal in size with greater than 50% respiratory variability, suggesting right atrial pressure of 3 mmHg. IAS/Shunts: The interatrial septum was not well visualized.  LEFT VENTRICLE PLAX 2D LVIDd:         4.36 cm  Diastology LVIDs:         2.35 cm  LV e' medial:    7.40 cm/s LV PW:         1.21 cm  LV E/e' medial:  14.3 LV IVS:        1.12 cm  LV e' lateral:   10.20 cm/s LVOT diam:     1.90 cm  LV E/e' lateral: 10.4 LV SV:         73 LV SV Index:   41 LVOT Area:     2.84 cm  RIGHT VENTRICLE RV S prime:     12.00 cm/s TAPSE (M-mode): 2.6 cm LEFT ATRIUM             Index       RIGHT ATRIUM           Index LA diam:        3.70 cm 2.08 cm/m  RA Area:     13.20 cm LA Vol (A2C):   36.6 ml 20.61 ml/m RA Volume:   32.00 ml  18.02 ml/m LA Vol (A4C):   52.3 ml 29.45 ml/m LA Biplane Vol: 43.2 ml 24.32 ml/m  AORTIC VALVE AV Area (Vmax):    2.22 cm AV Area (Vmean):   1.87 cm AV Area (VTI):     1.79 cm AV Vmax:           179.17 cm/s AV Vmean:          125.693 cm/s AV VTI:            0.407 m AV Peak Grad:      12.8 mmHg AV Mean Grad:      7.1 mmHg LVOT Vmax:         140.54 cm/s LVOT Vmean:        82.913 cm/s LVOT VTI:          0.257 m LVOT/AV VTI ratio: 0.63  AORTA Ao Root diam: 2.50 cm MITRAL VALVE MV Area (PHT): 4.06 cm     SHUNTS  MV Decel Time: 187 msec     Systemic VTI:  0.26 m MV E velocity: 106.00 cm/s  Systemic Diam: 1.90 cm MV A velocity: 81.40 cm/s MV E/A ratio:  1.30 Dina Rich MD Electronically signed by Dina Rich MD Signature Date/Time: 08/22/2020/4:02:43 PM    Final    CT  HEAD CODE STROKE WO CONTRAST  Result Date: 08/21/2020 CLINICAL DATA:  Code stroke. Initial evaluation for acute altered mental status, confusion. EXAM: CT HEAD WITHOUT CONTRAST TECHNIQUE: Contiguous axial images were obtained from the base of the skull through the vertex without intravenous contrast. COMPARISON:  None available. FINDINGS: Brain: Age-related cerebral atrophy with mild chronic small vessel ischemic disease. Scattered areas of multifocal encephalomalacia involving the anterior left frontotemporal region most likely related to remote trauma. No acute intracranial hemorrhage. No acute large vessel territory infarct. No mass lesion, mass effect, or midline shift. No hydrocephalus or extra-axial fluid collection. Vascular: No hyperdense vessel. Skull: Scalp soft tissues within normal limits. Calvarium intact. Postsurgical changes partially visualize within the upper cervical spine. Sinuses/Orbits: Globes and orbital soft tissues within normal limits. Paranasal sinuses are clear. No mastoid effusion. Other: None. ASPECTS [facility] Stroke Program Early CT Score) - Ganglionic level infarction (caudate, lentiform nuclei, internal capsule, insula, M1-M3 cortex): 7 - Supraganglionic infarction (M4-M6 cortex): 3 Total score (0-10 with 10 being normal): 10 IMPRESSION: 1. No acute intracranial infarct or other abnormality. 2. ASPECTS is 10. 3. Chronic left frontotemporal encephalomalacia, most likely related to remote trauma. 4. Underlying age-related cerebral atrophy with mild chronic small vessel ischemic disease. Critical Value/emergent results were called by telephone at the time of interpretation on 08/21/2020 at 9:37 pm to provider JOSEPH ZAMMIT  , who verbally acknowledged these results. Electronically Signed   By: Rise Mu M.D.   On: 08/21/2020 21:39    Labs:  CBC: Recent Labs    08/23/20 0705 08/24/20 0714 08/25/20 0458 08/26/20 0309  WBC 6.1 5.7 5.5 5.7  HGB 7.3* 7.4* 7.1* 7.9*  HCT 25.9* 26.3* 25.8* 28.3*  PLT 326 303 317 360    COAGS: Recent Labs    08/21/20 2135  INR 1.1  APTT 29    BMP: Recent Labs    08/21/20 2135 08/23/20 0705 08/24/20 0714 08/26/20 0309  NA 138 139 140 143  K 3.5 3.6 3.6 4.1  CL 108 108 104 110  CO2 19* GLUCOSE 139* 91 93 102*  BUN CALCIUM 8.8* 8.4* 8.6* 9.2  CREATININE 0.59 0.50 0.51 0.58  GFRNONAA >60 >60 >60 >60  GFRAA >60 >60 >60 >60    LIVER FUNCTION TESTS: Recent Labs    08/21/20 2135  BILITOT 0.7  AST 19  ALT 26  ALKPHOS 60  PROT 7.4  ALBUMIN 3.6    TUMOR MARKERS: No results for input(s): AFPTM, CEA, CA199, CHROMGRNA in the last 8760 hours.  Assessment and Plan: Acute symptomatic T11 compression fracture. Imaging reviewed by Dr. Corliss Skains, there is about 3 mm retropulsion, therefore would defer balloon use and only proceed with cement vertebral augmentation. No infectious or anticoagulation issues. Will make pt NPO p MN Tentatively plan for T11 VP tomorrow. Risks and benefits of T11 VP  were discussed with the patient including, but not limited to education regarding the natural healing process of compression fractures without intervention, bleeding, infection, cement migration which may cause spinal cord damage, paralysis, pulmonary embolism or even death.  This interventional procedure involves the use of X-rays and because of the nature of the planned procedure, it is possible that we will have prolonged use of X-ray fluoroscopy.  Potential radiation risks to you include (but are not limited to) the following: - A slightly elevated risk for cancer  several years later in life. This risk is typically less than 0.5%  percent. This  risk is low in comparison to the normal incidence of human cancer, which is 33% for women and 50% for men according to the [employer]. - Radiation induced injury can include skin redness, resembling a rash, tissue breakdown / ulcers and hair loss (which can be temporary or permanent).   The likelihood of either of these occurring depends on the difficulty of the procedure and whether you are sensitive to radiation due to previous procedures, disease, or genetic conditions.   IF your procedure requires a prolonged use of radiation, you will be notified and given written instructions for further action.  It is your responsibility to monitor the irradiated area for the 2 weeks following the procedure and to notify your physician if you are concerned that you have suffered a radiation induced injury.    All of the patient's questions were answered, patient is agreeable to proceed.  Consent signed and in chart.    Thank you for this interesting consult.  I greatly enjoyed meeting Kylie Wells and look forward to participating in their care.  A copy of this report was sent to the requesting provider on this date.  Electronically Signed: Brayton El, PA-C 08/26/2020, 1:29 PM   I spent a total of 20 minutes in face to face in clinical consultation, greater than 50% of which was counseling/coordinating care for vertebroplasty

## 2020-08-27 ENCOUNTER — Inpatient Hospital Stay (HOSPITAL_COMMUNITY): Payer: Medicare HMO

## 2020-08-27 ENCOUNTER — Encounter (HOSPITAL_COMMUNITY): Payer: Self-pay | Admitting: Internal Medicine

## 2020-08-27 ENCOUNTER — Other Ambulatory Visit: Payer: Self-pay

## 2020-08-27 HISTORY — PX: IR VERTEBROPLASTY CERV/THOR BX INC UNI/BIL INC/INJECT/IMAGING: IMG5515

## 2020-08-27 LAB — CBC WITH DIFFERENTIAL/PLATELET
Abs Immature Granulocytes: 0.01 10*3/uL (ref 0.00–0.07)
Basophils Absolute: 0 10*3/uL (ref 0.0–0.1)
Basophils Relative: 1 %
Eosinophils Absolute: 0.2 10*3/uL (ref 0.0–0.5)
Eosinophils Relative: 3 %
HCT: 28.6 % — ABNORMAL LOW (ref 36.0–46.0)
Hemoglobin: 7.8 g/dL — ABNORMAL LOW (ref 12.0–15.0)
Immature Granulocytes: 0 %
Lymphocytes Relative: 28 %
Lymphs Abs: 1.6 10*3/uL (ref 0.7–4.0)
MCH: 22.2 pg — ABNORMAL LOW (ref 26.0–34.0)
MCHC: 27.3 g/dL — ABNORMAL LOW (ref 30.0–36.0)
MCV: 81.3 fL (ref 80.0–100.0)
Monocytes Absolute: 0.4 10*3/uL (ref 0.1–1.0)
Monocytes Relative: 7 %
Neutro Abs: 3.5 10*3/uL (ref 1.7–7.7)
Neutrophils Relative %: 61 %
Platelets: 343 10*3/uL (ref 150–400)
RBC: 3.52 MIL/uL — ABNORMAL LOW (ref 3.87–5.11)
RDW: 23.9 % — ABNORMAL HIGH (ref 11.5–15.5)
WBC: 5.8 10*3/uL (ref 4.0–10.5)
nRBC: 0 % (ref 0.0–0.2)

## 2020-08-27 LAB — BASIC METABOLIC PANEL
Anion gap: 10 (ref 5–15)
BUN: 9 mg/dL (ref 6–20)
CO2: 23 mmol/L (ref 22–32)
Calcium: 9.1 mg/dL (ref 8.9–10.3)
Chloride: 109 mmol/L (ref 98–111)
Creatinine, Ser: 0.66 mg/dL (ref 0.44–1.00)
GFR calc Af Amer: >60 mL/min (ref >60)
GFR calc non Af Amer: >60 mL/min (ref >60)
Glucose, Bld: 95 mg/dL (ref 70–99)
Potassium: 4.1 mmol/L (ref 3.5–5.1)
Sodium: 142 mmol/L (ref 135–145)

## 2020-08-27 LAB — SURGICAL PATHOLOGY

## 2020-08-27 IMAGING — XA IR VERTEBROPLASTY CERVICOTHORACIC INJ
7 of 9 series · 14 of 24 positions shown · non-contrast
Comparison: none

INDICATION: Severe thoracolumbar pain secondary to compression fracture at T11.
TECHNIQUE: Informed written consent was obtained from the patient after a
thorough discussion of the procedural risks, benefits and
alternatives. All questions were addressed. Maximal Sterile Barrier
Technique was utilized including caps, mask, sterile gowns, sterile
gloves, sterile drape, hand hygiene and skin antiseptic. A timeout
was performed prior to the initiation of the procedure.

[Series 1: kypho dsa · 2 acquisitions, 4 frames shown (1 of 2)]
[im 1/2]
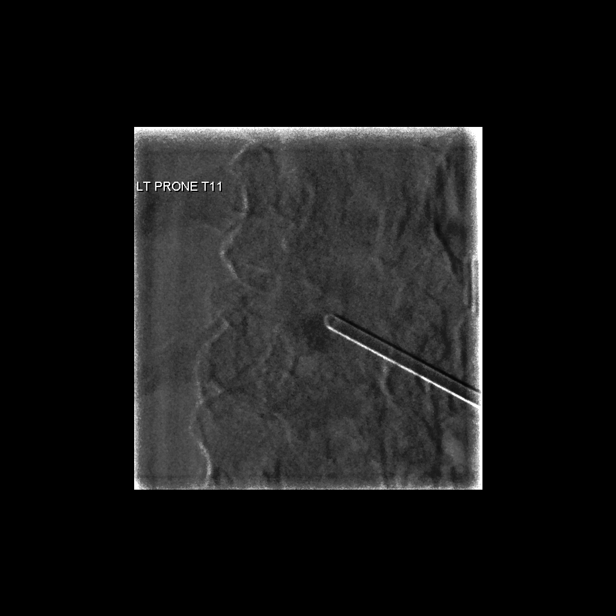
[im 1/2]
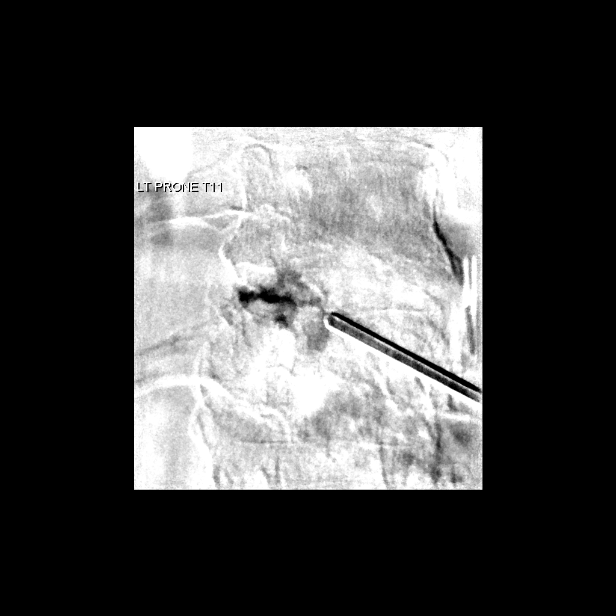
[im 2/2]
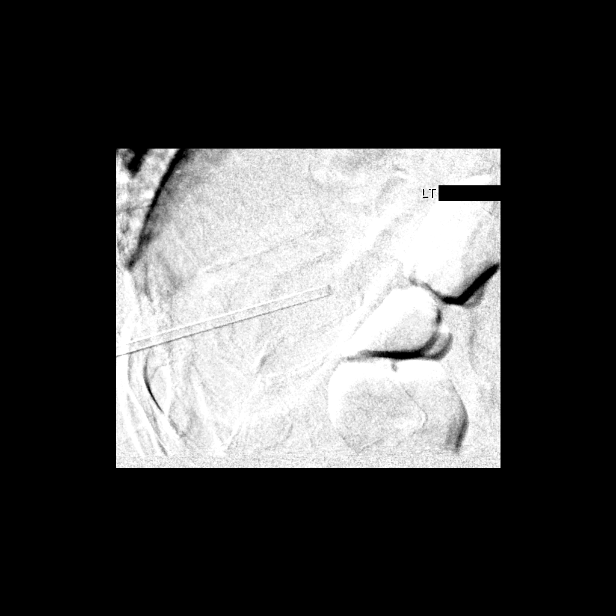
[im 2/2]
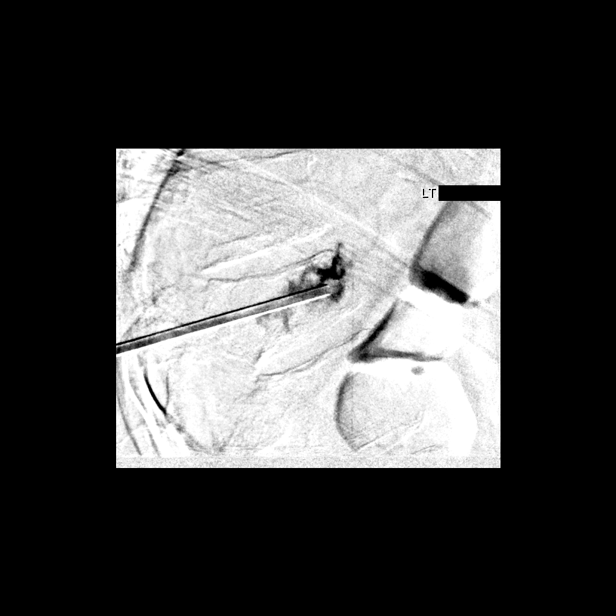

[Series 2: single · 1 of 2 slices shown (1 of 5)]
[im 1/2]
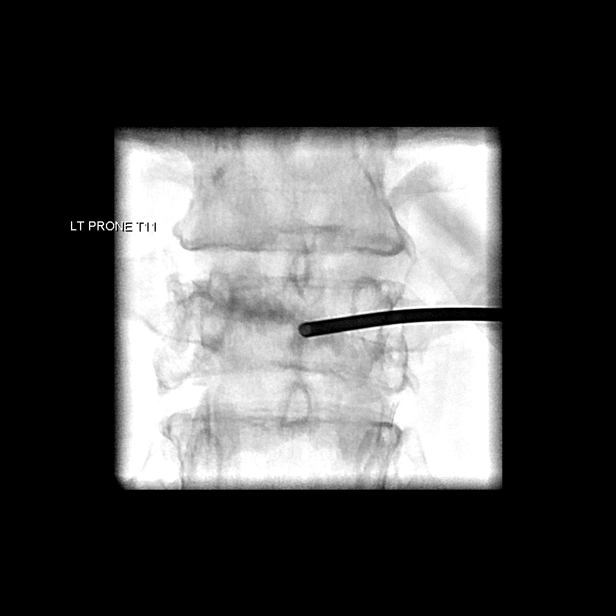

[Series 3: kypho dsa · 2 acquisitions, 4 frames shown (2 of 2)]
[im 1/2]
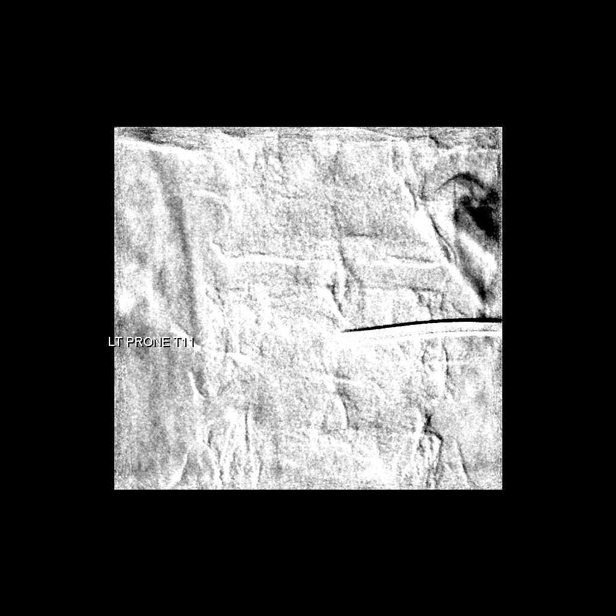
[im 1/2]
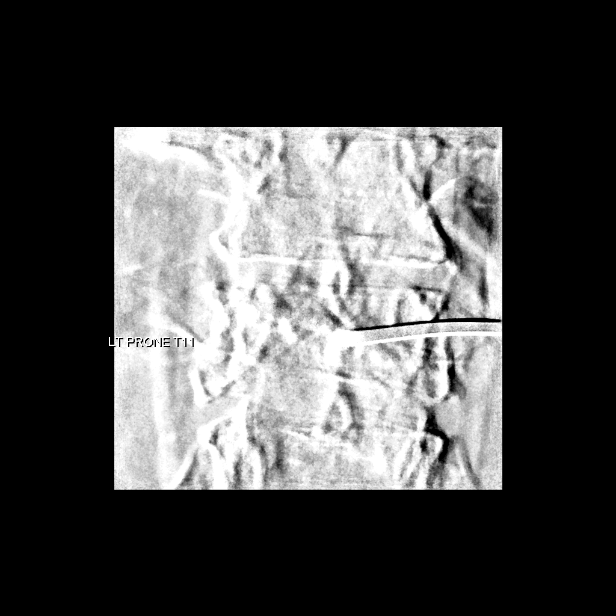
[im 2/2]
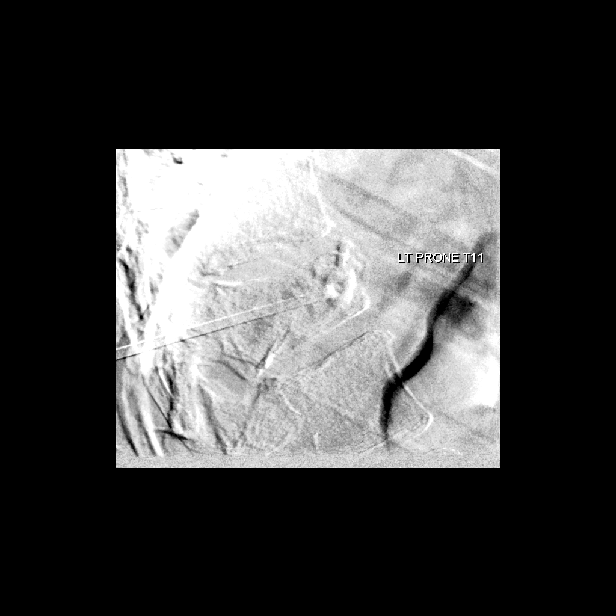
[im 2/2]
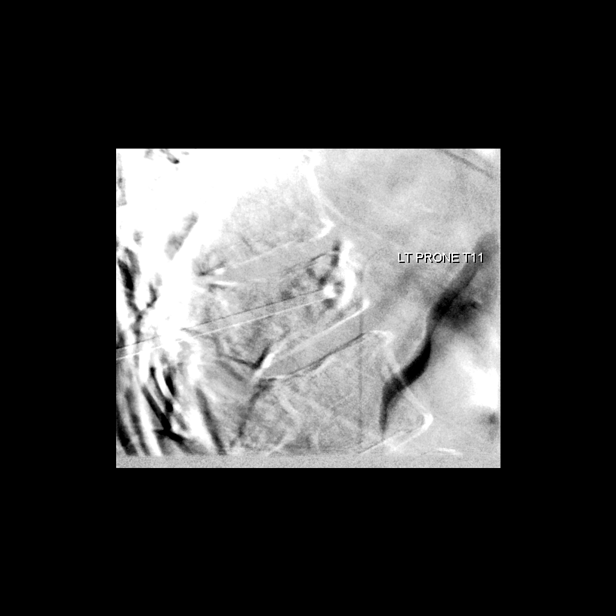

[Series 4: single · 1 of 2 slices shown (2 of 5)]
[im 2/2]
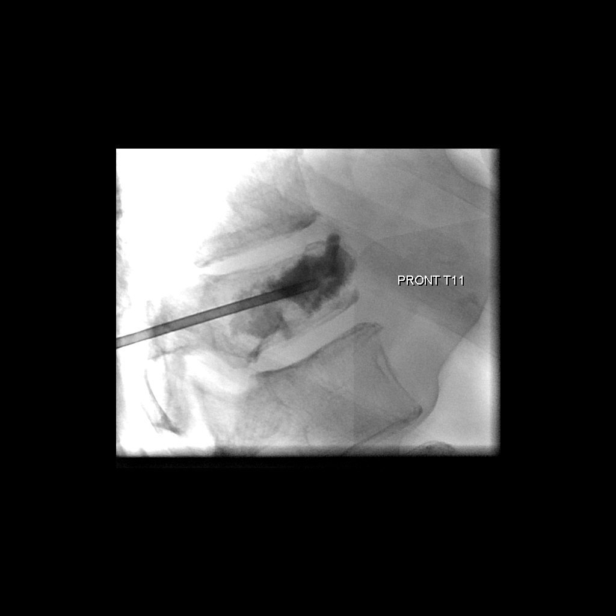

[Series 6: single · 2 of 2 slices shown (3 of 5)]
[im 1/2]
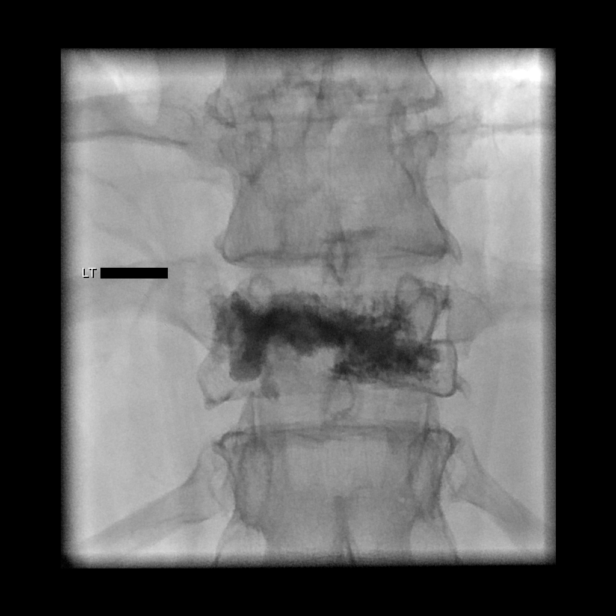
[im 2/2]
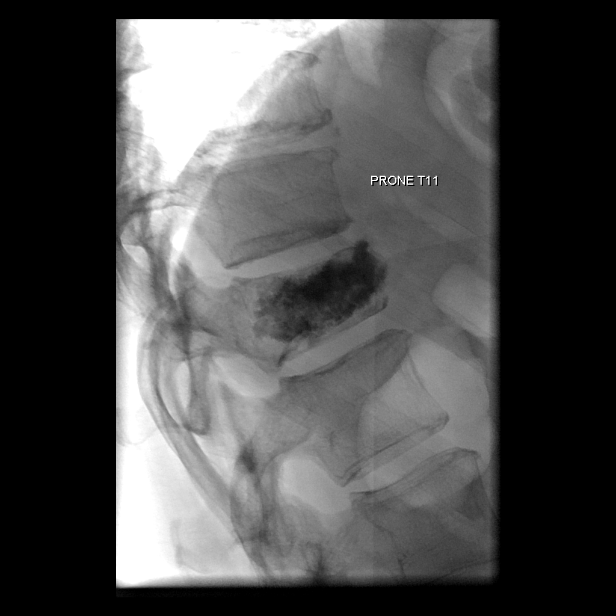

[Series 7: single · 1 of 2 slices shown (4 of 5)]
[im 2/2]
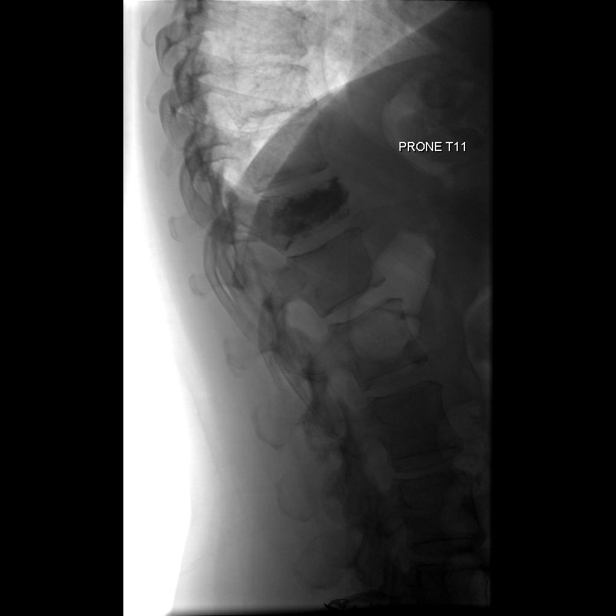

[Series 9: single · 1 of 1 slices shown (5 of 5)]
[im 1/1]
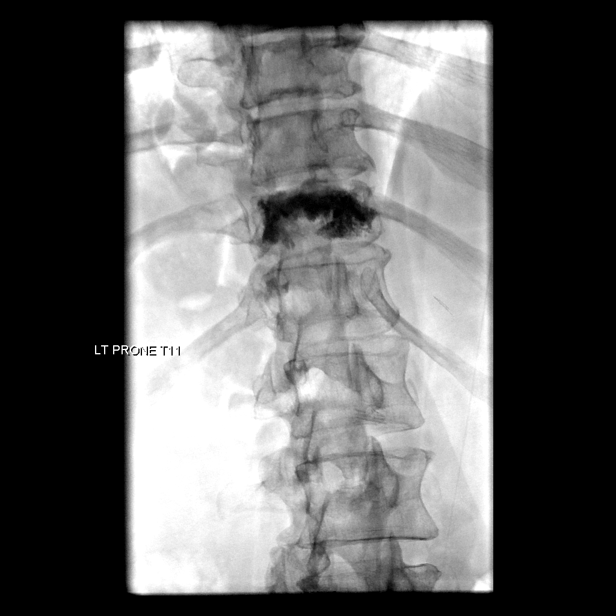

[14 of 24 positions shown; findings below may reference images not displayed]

EXAM:
VERTEBROPLASTY AT T11

MEDICATIONS:
As antibiotic prophylaxis, 2 g Ancef was ordered pre-procedure and
administered intravenously within 1 hour of incision.

ANESTHESIA/SEDATION:
Moderate (conscious) sedation was employed during this procedure. A
total of Versed 2 mg and Fentanyl 50 mcg was administered
intravenously.

Moderate Sedation Time: 24 minutes. The patient's level of
consciousness and vital signs were monitored continuously by
radiology nursing throughout the procedure under my direct
supervision.

FLUOROSCOPY TIME:  Fluoroscopy Time: 8 minutes 30 seconds (1,300
mGy)

COMPLICATIONS:
None immediate.
PROCEDURE:
The patient was placed prone on the fluoroscopic table. Nasal oxygen
was administered. Physiologic monitoring was performed throughout
the duration of the procedure. The skin overlying the thoracolumbar
region was prepped and draped in the usual sterile fashion. The T11
vertebral body was identified and the right pedicle was infiltrated
with 0.25% Bupivacaine. This was then followed by the advancement of
a 13-gauge Cook needle through the right pedicle into the anterior
one-third at T11. A gentle contrast injection demonstrated a
trabecular pattern of contrast.

At this time, methylmethacrylate mixture was reconstituted. Under
biplane intermittent fluoroscopy, the methylmethacrylate was then
injected into the T11 vertebral body with filling of the vertebral
body.

No extravasation was noted into the disk spaces or posteriorly into
the spinal canal. No epidural venous contamination was seen.

The needle was then removed. Hemostasis was achieved at the skin
entry site.

There were no acute complications. Patient tolerated the procedure
well. The patient was returned to the floor in good stable
condition.
IMPRESSION: 1. Status post vertebral body augmentation for painful compression
fracture at T11 using vertebroplasty technique.

## 2020-08-27 MED ORDER — SODIUM CHLORIDE 0.9 % IV SOLN: INTRAVENOUS | Status: AC

## 2020-08-27 MED ORDER — MIDAZOLAM HCL 2 MG/2ML IJ SOLN
INTRAMUSCULAR | Status: AC | PRN
  Administered 2020-08-27 (×2): 1 mg via INTRAVENOUS

## 2020-08-27 MED ORDER — CEFAZOLIN SODIUM-DEXTROSE 2-4 GM/100ML-% IV SOLN
INTRAVENOUS | Status: AC
  Administered 2020-08-27: 2 g via INTRAVENOUS
  Filled 2020-08-27: qty 100

## 2020-08-27 MED ORDER — MIDAZOLAM HCL 2 MG/2ML IJ SOLN
INTRAMUSCULAR | Status: AC
  Filled 2020-08-27: qty 2

## 2020-08-27 MED ORDER — IOHEXOL 300 MG/ML  SOLN
50.0000 mL | Freq: Once | INTRAMUSCULAR | Status: AC | PRN
  Administered 2020-08-27: 1 mL

## 2020-08-27 MED ORDER — FENTANYL CITRATE (PF) 100 MCG/2ML IJ SOLN
INTRAMUSCULAR | Status: AC
  Filled 2020-08-27: qty 2

## 2020-08-27 MED ORDER — TOBRAMYCIN SULFATE 1.2 G IJ SOLR
INTRAMUSCULAR | Status: AC
  Filled 2020-08-27: qty 1.2

## 2020-08-27 MED ORDER — FENTANYL CITRATE (PF) 100 MCG/2ML IJ SOLN
INTRAMUSCULAR | Status: AC | PRN
  Administered 2020-08-27 (×2): 25 ug via INTRAVENOUS

## 2020-08-27 MED ORDER — BUPIVACAINE HCL (PF) 0.5 % IJ SOLN
INTRAMUSCULAR | Status: AC
  Filled 2020-08-27: qty 30

## 2020-08-27 NOTE — Procedures (Signed)
S/P T 11 VP . S .Cherita Hebel MD 

## 2020-08-27 NOTE — Progress Notes (Signed)
PROGRESS NOTE  Kylie Wells  DOB: 08/24/1966  PCP: Pcp, No [id number]  DOA: 08/21/2020  LOS: 5 days   Chief Complaint  Patient presents with  . Altered Mental Status    Brief narrative: Kylie Wells is a 54 y.o. female with PMH of GERD, TBI in 2018 and chronic left foot pain on Advil She was brought to the ED at [facility] on 9/22 for altered mental status.  Evidently she had a fall few days ago, sustained excruciating lower back pain and took more Advil and muscle relaxants.  9/22, she went to an urgent care sent and was started on prednisone taper.  She became more confused and was hence brought to the ED.  In the ED, her hemoglobin was low at 3.7.  She was given 3 units of PRBC. She was admitted to hospitalist service. MRI brain was obtained for altered mental status which did not show any evidence of a stroke. 9/24, she underwent EGD that showed mild gastritis only.  Her hemoglobin level is stabilized 9/25, she was transferred to [facility] for MRI of her back and possible need kyphoplasty.  Subjective: Patient was seen and examined this morning. Lying on bed.  Not in distress.  Husband at bedside.  Hemoglobin improved 7.9. Radiology plans to do kyphoplasty tomorrow.  Assessment/Plan: Acute T11 compression fracture -Secondary to a fall. -Underwent kyphoplasty by IR today.  -Continue pain management. -PT eval postprocedure.  Severe acute blood loss anemia Severe iron deficiency anemia Likely chronic GI bleeding. -Patient has been on Motrin for a long time.  She probably was having chronic GI bleeding.  She did not have symptoms related to anemia at presentation however her hemoglobin was low at 3.7. -3 units PRBCs given.  7.8 today. -9/24, EGD showed mild gastritis. Continue PPI. May need colonoscopy in capsule endoscopy as an outpatient. -Anemia studies with ferritin low at 7.  1 unit of Feraheme to be given yesterday 9/27. -Oral iron pills at  discharge Recent Labs    08/23/20 0705 08/23/20 0705 08/24/20 0714 08/24/20 0714 08/25/20 0458 08/25/20 0458 08/26/20 0309 08/27/20 0359  HGB 7.3*  --  7.4*  --  7.1*  --  7.9* 7.8*  MCV 80.9   < > 80.9   < > 79.6*   < > 80.4 81.3  VITAMINB12  --   --   --   --   --   --  948*  --   FOLATE  --   --   --   --   --   --  26.7  --   FERRITIN  --   --   --   --   --   --  7*  --   TIBC  --   --   --   --   --   --  522*  --   IRON  --   --   --   --   --   --  19*  --    < > = values in this interval not displayed.   Acute toxic metabolic encephalopathy -Presented with altered mental status, likely secondary to muscle relaxant and opioids  -MRI studies negative for CVA -2D echocardiogram with no acute findings  GERD -Continue PPI  Mobility: PT evaluation obtained.  No follow-up of supervision required. Code Status:   Code Status: Full Code  Nutritional status: Body mass index is 32.7 kg/m.     Diet Order  Diet NPO time specified Except for: Sips with Meds  Diet effective midnight                 DVT prophylaxis: SCDs Start: 08/22/20 0155   Antimicrobials:  None Fluid: None Consultants: Radiology Family Communication:  Husband at bedside  Status is: Inpatient  Remains inpatient appropriate because: Kyphoplasty done today   Dispo: The patient is from: Home              Anticipated d/c is to: Home              Anticipated d/c date is:  tomorrow              Patient currently is not medically stable to d/c.       Infusions:  . sodium chloride      Scheduled Meds: .  stroke: mapping our early stages of recovery book   Does not apply Once  . acetaminophen  650 mg Oral Q6H  . bupivacaine      . Chlorhexidine Gluconate Cloth  6 each Topical Q0600  . lidocaine  1 patch Transdermal Q24H  . mupirocin ointment  1 application Nasal BID  . pantoprazole  40 mg Oral Daily  . tobramycin        Antimicrobials: Anti-infectives (From admission,  onward)   Start     Dose/Rate Route Frequency Ordered Stop   08/27/20 1110  tobramycin (NEBCIN) 1.2 g powder       Note to Pharmacy: Falls, Heather   : cabinet override      08/27/20 1110 08/27/20 2314   08/27/20 0000  ceFAZolin (ANCEF) IVPB 2g/100 mL premix        2 g 200 mL/hr over 30 Minutes Intravenous On call 08/26/20 1502 08/27/20 1141      PRN meds: HYDROmorphone (DILAUDID) injection, LORazepam, ondansetron (ZOFRAN) IV, polyethylene glycol   Objective: Vitals:   08/27/20 1215 08/27/20 1317  BP: 127/77 118/82  Pulse: 62 61  Resp: 16 18  Temp:  98.5 F (36.9 C)  SpO2: 98% 96%    Intake/Output Summary (Last 24 hours) at 08/27/2020 1320 Last data filed at 08/26/2020 1700 Gross per 24 hour  Intake 600 ml  Output --  Net 600 ml   Filed Weights   08/21/20 2104 08/22/20 2020 08/23/20 1302  Weight: 78.5 kg 81.1 kg 81.1 kg   Weight change:  Body mass index is 32.7 kg/m.   Physical Exam: General exam: Appears calm and comfortable. Not in distress at rest Skin: No rashes, lesions or ulcers. HEENT: Atraumatic, normocephalic, supple neck, no obvious bleeding Lungs: Clear to auscultation bilaterally CVS: Regular rate and rhythm, no murmur GI/Abd soft, nontender, nondistended, bowel sound present CNS: Alert, awake, oriented x3 Psychiatry: Mood appropriate Extremities: No pedal edema, no calf tenderness  Data Review: I have personally reviewed the laboratory data and studies available.  Recent Labs  Lab 08/21/20 2135 08/21/20 2135 08/21/20 2232 08/22/20 0525 08/23/20 0705 08/24/20 0714 08/25/20 0458 08/26/20 0309 08/27/20 0359  WBC 7.9   < > 8.0   < > 6.1 5.7 5.5 5.7 5.8  NEUTROABS 7.0  --  6.9  --   --   --   --  3.5 3.5  HGB 3.7*   < > 3.7*   < > 7.3* 7.4* 7.1* 7.9* 7.8*  HCT 15.3*   < > 16.1*   < > 25.9* 26.3* 25.8* 28.3* 28.6*  MCV 72.2*   < > 74.2*   < >  80.9 80.9 79.6* 80.4 81.3  PLT 363   < > PLATELET CLUMPS NOTED ON SMEAR, UNABLE TO ESTIMATE   < > 326  303 317 360 343   < > = values in this interval not displayed.   Recent Labs  Lab 08/21/20 2135 08/23/20 0705 08/24/20 0714 08/26/20 0309 08/27/20 0359  NA 138 139 140 143 142  K 3.5 3.6 3.6 4.1 4.1  CL 108 108 104 110 109  CO2 19* 22 25 26 23   GLUCOSE 139* 91 93 102* 95  BUN 17 11 10 8 9   CREATININE 0.59 0.50 0.51 0.58 0.66  CALCIUM 8.8* 8.4* 8.6* 9.2 9.1  MG  --  1.9  --   --   --    F/u labs ordered  Signed, , MD [facility] 08/27/2020

## 2020-08-27 NOTE — Progress Notes (Signed)
PT Cancellation Note  Patient Details Name: Kylie Wells MRN: [id number] DOB: 24-Mar-1966   Cancelled Treatment:    Reason Eval/Treat Not Completed: Patient not medically ready. Discussed with RN, pt is on flat bedrest this afternoon. Will follow up tomorrow.    Marylynn Pearson 08/27/2020, 1:38 PM   Conni Slipper, PT, DPT Acute Rehabilitation Services Pager: [phone] Office: [phone]

## 2020-08-28 DIAGNOSIS — K922 Gastrointestinal hemorrhage, unspecified: Secondary | ICD-10-CM | POA: Diagnosis present

## 2020-08-28 DIAGNOSIS — D62 Acute posthemorrhagic anemia: Secondary | ICD-10-CM

## 2020-08-28 LAB — CBC WITH DIFFERENTIAL/PLATELET
Abs Immature Granulocytes: 0.02 10*3/uL (ref 0.00–0.07)
Basophils Absolute: 0 10*3/uL (ref 0.0–0.1)
Basophils Relative: 1 %
Eosinophils Absolute: 0.2 10*3/uL (ref 0.0–0.5)
Eosinophils Relative: 3 %
HCT: 31.1 % — ABNORMAL LOW (ref 36.0–46.0)
Hemoglobin: 8.5 g/dL — ABNORMAL LOW (ref 12.0–15.0)
Immature Granulocytes: 0 %
Lymphocytes Relative: 33 %
Lymphs Abs: 2 10*3/uL (ref 0.7–4.0)
MCH: 22.2 pg — ABNORMAL LOW (ref 26.0–34.0)
MCHC: 27.3 g/dL — ABNORMAL LOW (ref 30.0–36.0)
MCV: 81.2 fL (ref 80.0–100.0)
Monocytes Absolute: 0.4 10*3/uL (ref 0.1–1.0)
Monocytes Relative: 7 %
Neutro Abs: 3.3 10*3/uL (ref 1.7–7.7)
Neutrophils Relative %: 56 %
Platelets: 349 10*3/uL (ref 150–400)
RBC: 3.83 MIL/uL — ABNORMAL LOW (ref 3.87–5.11)
RDW: 24.1 % — ABNORMAL HIGH (ref 11.5–15.5)
WBC: 5.9 10*3/uL (ref 4.0–10.5)
nRBC: 0 % (ref 0.0–0.2)

## 2020-08-28 LAB — BASIC METABOLIC PANEL
Anion gap: 11 (ref 5–15)
BUN: 11 mg/dL (ref 6–20)
CO2: 24 mmol/L (ref 22–32)
Calcium: 9.4 mg/dL (ref 8.9–10.3)
Chloride: 107 mmol/L (ref 98–111)
Creatinine, Ser: 0.55 mg/dL (ref 0.44–1.00)
GFR calc Af Amer: >60 mL/min (ref >60)
GFR calc non Af Amer: >60 mL/min (ref >60)
Glucose, Bld: 86 mg/dL (ref 70–99)
Potassium: 4.3 mmol/L (ref 3.5–5.1)
Sodium: 142 mmol/L (ref 135–145)

## 2020-08-28 MED ORDER — TRAMADOL HCL 50 MG PO TABS: 50.0000 mg | ORAL_TABLET | Freq: Four times a day (QID) | ORAL | 0 refills | Status: DC | PRN

## 2020-08-28 MED ORDER — LIDOCAINE 5 % EX PTCH: 1.0000 | MEDICATED_PATCH | CUTANEOUS | 0 refills | Status: DC

## 2020-08-28 MED ORDER — LIDOCAINE 5 % EX PTCH: 1.0000 | MEDICATED_PATCH | CUTANEOUS | 0 refills | Status: AC

## 2020-08-28 MED ORDER — TRAMADOL HCL 50 MG PO TABS: 50.0000 mg | ORAL_TABLET | Freq: Four times a day (QID) | ORAL | 0 refills | Status: AC | PRN

## 2020-08-28 MED ORDER — BUPIVACAINE HCL (PF) 0.25 % IJ SOLN
INTRAMUSCULAR | Status: AC | PRN
  Administered 2020-08-28: 15 mL

## 2020-08-28 MED ORDER — FERROUS SULFATE 325 (65 FE) MG PO TABS: 325.0000 mg | ORAL_TABLET | Freq: Every day | ORAL | 0 refills | Status: DC

## 2020-08-28 MED ORDER — FERROUS SULFATE 325 (65 FE) MG PO TABS: 325.0000 mg | ORAL_TABLET | Freq: Every day | ORAL | 0 refills | Status: AC

## 2020-08-28 NOTE — TOC Transition Note (Signed)
Transition of Care [facility]) - CM/SW Discharge Note   Patient Details  Name: Kylie Wells MRN: [id number] Date of Birth: 24-Sep-1966  [facility] CM/SW Contact:  Kermit Balo, RN Phone Number: 08/28/2020, 12:17 PM   Clinical Narrative:    Pt discharging home with self care. No f/u per PT/OT and no DME needs.  Pt has transportation home.   Final next level of care: Home/Self Care Barriers to Discharge: No Barriers Identified   Patient Goals and CMS Choice        Discharge Placement                       Discharge Plan and Services                                     Social Determinants of Health (SDOH) Interventions     Readmission Risk Interventions No flowsheet data found.

## 2020-08-28 NOTE — Discharge Summary (Addendum)
Physician Discharge Summary  Kylie Wells ZOX:[id number] DOB: 1966-05-06 DOA: 08/21/2020  PCP: Oneita Hurt, No  Admit date: 08/21/2020 Discharge date: 08/28/2020  Admitted From: Home  Discharge disposition: Home   Code Status: Full Code  Diet Recommendation: Regular diet  Discharge Diagnosis:   Principal Problem:   Acute on chronic blood loss anemia Active Problems:   Chronic GI bleeding   Compression fracture of T11 vertebra (HCC)   GERD (gastroesophageal reflux disease)   Microcytic anemia   Asymptomatic bacteriuria   Class 1 obesity  History of Present Illness / Brief narrative:  Kylie Wells is a 54 y.o. female with PMH of GERD, TBI in 2018 and chronic left foot pain on Advil She was brought to the ED at [facility] on 9/22 for altered mental status.  Evidently she had a fall few days ago, sustained excruciating lower back pain and took more Advil and muscle relaxants.  9/22, she went to an urgent care sent and was started on prednisone taper.  She became more confused and was hence brought to the ED.  In the ED, her hemoglobin was low at 3.7.  She was given 3 units of PRBC. She was admitted to hospitalist service. MRI brain was obtained for altered mental status which did not show any evidence of a stroke. 9/24, she underwent EGD that showed mild gastritis only.  Her hemoglobin level is stabilized 9/25, she was transferred to [facility] for MRI of her back and possible need kyphoplasty.  Subjective:  Patient was seen and examined this morning.  Pleasant middle-aged Caucasian female.  Lying on bed.  Not in distress.  Pain controlled.  Feels ready to go home  Hospital Course:  Acute T11 compression fracture -Secondary to a fall. -Underwent kyphoplasty by IR on 9/28.  Pain is improving..  -Continue tramadol at home.  Avoid NSAIDs. -PT eval obtained.  No PT follow-up recommended.  Severe acute blood loss anemia Severe iron deficiency anemia Likely chronic GI  bleeding. History of GERD -Patient was on Motrin for a long time.  She probably had chronic GI bleeding. She did not have symptoms related to anemia at presentation however her hemoglobin was low at 3.7. -3 units PRBCs given.  Hemoglobin improved eventually, 8.5 today. -9/24, EGD showed mild gastritis. Continue PPI. May need colonoscopy in capsule endoscopy as an outpatient. -Anemia studies with ferritin low at 7.  1 unit of Feraheme to be given on 9/27. -Oral iron pills at discharge. -Continue Protonix. -Follow-up with GI as an outpatient. Recent Labs    08/24/20 0714 08/24/20 0714 08/25/20 0458 08/25/20 0458 08/26/20 0309 08/26/20 0309 08/27/20 0359 08/28/20 0629  HGB 7.4*  --  7.1*  --  7.9*  --  7.8* 8.5*  MCV 80.9   < > 79.6*   < > 80.4   < > 81.3 81.2  VITAMINB12  --   --   --   --  948*  --   --   --   FOLATE  --   --   --   --  26.7  --   --   --   FERRITIN  --   --   --   --  7*  --   --   --   TIBC  --   --   --   --  522*  --   --   --   IRON  --   --   --   --  19*  --   --   --    < > =  values in this interval not displayed.   Acute toxic metabolic encephalopathy -Presented with altered mental status, likely secondary to muscle relaxant and opioids  -MRI studies negative for CVA -2D echocardiogram with no acute findings -Mental status is back to normal.  Stable for discharge to home today.  Wound care: Wound / Incision (Open or Dehisced) 08/27/20 Puncture Vertebral column Right (Active)  Date First Assessed/Time First Assessed: 08/27/20 1149   Wound Type: Puncture  Location: Vertebral column  Location Orientation: Right  Present on Admission: No    Assessments 08/27/2020 12:15 PM 08/27/2020  8:00 PM  Dressing Type Gauze (Comment);Transparent dressing Gauze (Comment)  Dressing Changed New New  Dressing Status Clean;Dry;Intact Clean;Dry  Site / Wound Assessment Dressing in place / Unable to assess Dressing in place / Unable to assess  Peri-wound Assessment  Intact Intact  Closure None --  Drainage Amount None None     No Linked orders to display    Discharge Exam:   Vitals:   08/27/20 1923 08/28/20 0024 08/28/20 0403 08/28/20 0759  BP: 113/72 120/60 128/69 130/74  Pulse: 75 61 73 67  Resp: 17 13 10 14   Temp: 98.4 F (36.9 C) 98.1 F (36.7 C) 98.2 F (36.8 C) 98.2 F (36.8 C)  TempSrc: Oral Oral Oral Oral  SpO2: 97% 95% 97% 96%  Weight:      Height:        Body mass index is 32.7 kg/m.  General exam: Appears calm and comfortable.  Not in physical distress Skin: No rashes, lesions or ulcers. HEENT: Atraumatic, normocephalic, supple neck, no obvious bleeding Lungs: Clear to auscultation bilaterally CVS: Regular rate and rhythm, no murmur GI/Abd soft, nontender, nondistended, bowel sound present CNS: Alert, awake, oriented x3 Psychiatry: Mood appropriate Extremities: No pedal edema, no calf tenderness  Follow ups:   Discharge Instructions    Increase activity slowly   Complete by: As directed    Leave dressing on - Keep it clean, dry, and intact until clinic visit   Complete by: As directed       Follow-up Information    [facility] Follow up.   Contact information: [address] [place] [address] [phone]              Recommendations for Outpatient Follow-Up:   1. Follow-up with PCP as an outpatient 2. Follow-up with GI as an outpatient  Discharge Instructions:  Follow with Primary MD Pcp, No in 7 days   Get CBC/BMP checked in next visit within 1 week by PCP or SNF MD ( we routinely change or add medications that can affect your baseline labs and fluid status, therefore we recommend that you get the mentioned basic workup next visit with your PCP, your PCP may decide not to get them or add new tests based on their clinical decision)  On your next visit with your PCP, please Get Medicines reviewed and adjusted.  Please request your PCP  to go  over all Hospital Tests and Procedure/Radiological results at the follow up, please get all Hospital records sent to your Prim MD by signing hospital release before you go home.  Activity: As tolerated with Full fall precautions use walker/cane & assistance as needed  For Heart failure patients - Check your Weight same time everyday, if you gain over 2 pounds, or you develop in leg swelling, experience more shortness of breath or chest pain, call your Primary MD immediately. Follow Cardiac Low Salt Diet  and 1.5 lit/day fluid restriction.  If you have smoked or chewed Tobacco in the last 2 yrs please stop smoking, stop any regular Alcohol  and or any Recreational drug use.  If you experience worsening of your admission symptoms, develop shortness of breath, life threatening emergency, suicidal or homicidal thoughts you must seek medical attention immediately by calling 911 or calling your MD immediately  if symptoms less severe.  You Must read complete instructions/literature along with all the possible adverse reactions/side effects for all the Medicines you take and that have been prescribed to you. Take any new Medicines after you have completely understood and accpet all the possible adverse reactions/side effects.   Do not drive, operate heavy machinery, perform activities at heights, swimming or participation in water activities or provide baby sitting services if your were admitted for syncope or siezures until you have seen by Primary MD or a Neurologist and advised to do so again.  Do not drive when taking Pain medications.  Do not take more than prescribed Pain, Sleep and Anxiety Medications  Wear Seat belts while driving.   Please note You were cared for by a hospitalist during your hospital stay. If you have any questions about your discharge medications or the care you received while you were in the hospital after you are discharged, you can call the unit and asked to speak with the  hospitalist on call if the hospitalist that took care of you is not available. Once you are discharged, your primary care physician will handle any further medical issues. Please note that NO REFILLS for any discharge medications will be authorized once you are discharged, as it is imperative that you return to your primary care physician (or establish a relationship with a primary care physician if you do not have one) for your aftercare needs so that they can reassess your need for medications and monitor your lab values.    Allergies as of 08/28/2020      Reactions   Shellfish Allergy    Sulfa Antibiotics       Medication List    STOP taking these medications   MAGNESIUM PO     TAKE these medications   ferrous sulfate 325 (65 FE) MG tablet Take 1 tablet (325 mg total) by mouth daily.   lidocaine 5 % Commonly known as: LIDODERM Place 1 patch onto the skin daily. Remove & Discard patch within 12 hours or as directed by MD   pantoprazole 40 MG tablet Commonly known as: PROTONIX Take 40 mg by mouth daily.   traMADol 50 MG tablet Commonly known as: Ultram Take 1 tablet (50 mg total) by mouth every 6 (six) hours as needed for up to 5 days.   vitamin B-12 500 MCG tablet Commonly known as: CYANOCOBALAMIN Take 500 mcg by mouth daily.            Discharge Care Instructions  (From admission, onward)         Start     Ordered   08/28/20 0000  Leave dressing on - Keep it clean, dry, and intact until clinic visit        08/28/20 1032         Time coordinating discharge: 35 minutes  The results of significant diagnostics from this hospitalization (including imaging, microbiology, ancillary and laboratory) are listed below for reference.    Procedures and Diagnostic Studies:   CT ABDOMEN PELVIS WO CONTRAST  Result Date: 08/21/2020 CLINICAL DATA:  Abdominal trauma, code  stroke. Fell 3 days ago. Patient brought in to ED confused. EXAM: CT CHEST, ABDOMEN AND PELVIS WITHOUT  CONTRAST TECHNIQUE: Multidetector CT imaging of the chest, abdomen and pelvis was performed following the standard protocol without IV contrast. COMPARISON:  X-ray abdomen 08/10/2017, chest x-ray 08/12/2017. FINDINGS: CT CHEST FINDINGS Cardiovascular: No significant vascular findings. Normal heart size. No pericardial effusion. Hyperdense myocardium compared to the cardiac chambers suggestive of anemia. The thoracic aorta is normal in caliber. Mild atherosclerotic plaque. No surrounding fat stranding of the aorta. Mediastinum/Nodes: No enlarged mediastinal, hilar, or axillary lymph nodes. Thyroid gland, trachea, and esophagus demonstrate no significant findings. Moderate volume hiatal hernia. No bowel wall thickening or pneumatosis of the stomach. Lungs/Pleura: Expriatory phase of respiration. Few scattered pulmonary micro nodules. No pulmonary mass. No focal consolidation. No pleural effusion or pneumothorax. Musculoskeletal: No chest wall abnormality No suspicious lytic or blastic osseous lesions. Multiple, old healed, nondisplaced rib fractures bilaterally. No acute displaced rib fracture. No acute displaced sternal fracture. Multilevel degenerative changes of the spine. Age-indeterminate compression fracture of the T11 vertebral body with greater than 40% height loss. CT ABDOMEN PELVIS FINDINGS Hepatobiliary: No focal liver abnormality is seen. Status post cholecystectomy. No biliary dilatation. Pancreas: Unremarkable. No pancreatic ductal dilatation or surrounding inflammatory changes. Spleen: Normal in size without focal abnormality. Adrenals/Urinary Tract: No adrenal nodule bilaterally. No nephrolithiasis, no hydronephrosis, and no contour-deforming renal mass. No ureterolithiasis or hydroureter. The urinary bladder is unremarkable. Stomach/Bowel: Stomach is within normal limits. Appendix appears normal in caliber with and a punctate appendicolith at its tip. No evidence of bowel wall thickening, distention,  or inflammatory changes. The cecum is noted to be medialized. Vascular/Lymphatic: No significant vascular findings are present. No fat stranding surrounding the aorta. Mild atherosclerotic plaque. No enlarged abdominal or pelvic lymph nodes. Reproductive: Uterus and bilateral adnexa are unremarkable. Other: No abdominal wall hernia or abnormality. No abdominopelvic ascites. Musculoskeletal: No abdominal wall hernia or abnormality. Fatty degeneration of the left tensor fascia lata muscle. Sclerotic lesion with rings and arcs morphology of the right S1 level (7:105, 3:89) as well as a nodular lesion that a similar within the left iliac bone (7:101). These lesions were previously identified on x-ray abdomen 08/10/2017 and therefore may represent bone island versus enchondromas. Otherwise no suspicious or new lytic or blastic osseous lesions. Multilevel degenerative changes of the spine. IMPRESSION: 1. No acute intrathoracic or intra-abdominal/intrapelvic traumatic injury on this noncontrast study. 2. Age-indeterminate, possibly acute, T11 vertebral body fracture with greater than 40% height loss. Correlate with tenderness to palpation. 3. Moderate-sized hiatal hernia. 4.  Aortic Atherosclerosis (ICD10-I70.0). Electronically Signed   By: Tish Frederickson M.D.   On: 08/21/2020 22:04   CT Chest Wo Contrast  Result Date: 08/21/2020 CLINICAL DATA:  Abdominal trauma, code stroke. Fell 3 days ago. Patient brought in to ED confused. EXAM: CT CHEST, ABDOMEN AND PELVIS WITHOUT CONTRAST TECHNIQUE: Multidetector CT imaging of the chest, abdomen and pelvis was performed following the standard protocol without IV contrast. COMPARISON:  X-ray abdomen 08/10/2017, chest x-ray 08/12/2017. FINDINGS: CT CHEST FINDINGS Cardiovascular: No significant vascular findings. Normal heart size. No pericardial effusion. Hyperdense myocardium compared to the cardiac chambers suggestive of anemia. The thoracic aorta is normal in caliber. Mild  atherosclerotic plaque. No surrounding fat stranding of the aorta. Mediastinum/Nodes: No enlarged mediastinal, hilar, or axillary lymph nodes. Thyroid gland, trachea, and esophagus demonstrate no significant findings. Moderate volume hiatal hernia. No bowel wall thickening or pneumatosis of the stomach. Lungs/Pleura: Expriatory phase of respiration. Few  scattered pulmonary micro nodules. No pulmonary mass. No focal consolidation. No pleural effusion or pneumothorax. Musculoskeletal: No chest wall abnormality No suspicious lytic or blastic osseous lesions. Multiple, old healed, nondisplaced rib fractures bilaterally. No acute displaced rib fracture. No acute displaced sternal fracture. Multilevel degenerative changes of the spine. Age-indeterminate compression fracture of the T11 vertebral body with greater than 40% height loss. CT ABDOMEN PELVIS FINDINGS Hepatobiliary: No focal liver abnormality is seen. Status post cholecystectomy. No biliary dilatation. Pancreas: Unremarkable. No pancreatic ductal dilatation or surrounding inflammatory changes. Spleen: Normal in size without focal abnormality. Adrenals/Urinary Tract: No adrenal nodule bilaterally. No nephrolithiasis, no hydronephrosis, and no contour-deforming renal mass. No ureterolithiasis or hydroureter. The urinary bladder is unremarkable. Stomach/Bowel: Stomach is within normal limits. Appendix appears normal in caliber with and a punctate appendicolith at its tip. No evidence of bowel wall thickening, distention, or inflammatory changes. The cecum is noted to be medialized. Vascular/Lymphatic: No significant vascular findings are present. No fat stranding surrounding the aorta. Mild atherosclerotic plaque. No enlarged abdominal or pelvic lymph nodes. Reproductive: Uterus and bilateral adnexa are unremarkable. Other: No abdominal wall hernia or abnormality. No abdominopelvic ascites. Musculoskeletal: No abdominal wall hernia or abnormality. Fatty degeneration  of the left tensor fascia lata muscle. Sclerotic lesion with rings and arcs morphology of the right S1 level (7:105, 3:89) as well as a nodular lesion that a similar within the left iliac bone (7:101). These lesions were previously identified on x-ray abdomen 08/10/2017 and therefore may represent bone island versus enchondromas. Otherwise no suspicious or new lytic or blastic osseous lesions. Multilevel degenerative changes of the spine. IMPRESSION: 1. No acute intrathoracic or intra-abdominal/intrapelvic traumatic injury on this noncontrast study. 2. Age-indeterminate, possibly acute, T11 vertebral body fracture with greater than 40% height loss. Correlate with tenderness to palpation. 3. Moderate-sized hiatal hernia. 4.  Aortic Atherosclerosis (ICD10-I70.0). Electronically Signed   By: Tish Frederickson M.D.   On: 08/21/2020 22:04   MR ANGIO HEAD WO CONTRAST  Result Date: 08/22/2020 CLINICAL DATA:  Ataxia, neuro deficit, acute. EXAM: MRI HEAD WITHOUT CONTRAST MRA HEAD WITHOUT CONTRAST MRA NECK WITHOUT AND WITH CONTRAST TECHNIQUE: Multiplanar, multiecho pulse sequences of the brain and surrounding structures were obtained without intravenous contrast. Angiographic images of the Circle of Willis were obtained using MRA technique without intravenous contrast. Angiographic images of the neck were obtained using MRA technique without and with intravenous contrast. Carotid stenosis measurements (when applicable) are obtained utilizing NASCET criteria, using the distal internal carotid diameter as the denominator. CONTRAST:  39mL GADAVIST GADOBUTROL 1 MMOL/ML IV SOLN COMPARISON:  08/21/2020 CT head code stroke. FINDINGS: MRI HEAD FINDINGS Brain: No diffusion-weighted signal abnormality. Sequela of prior bilateral frontal and left temporal insults with superficial siderosis. No acute intracranial hemorrhage. Mild cerebral atrophy with ex vacuo dilatation. Minimal chronic microvascular ischemic changes. No midline shift,  ventriculomegaly or extra-axial fluid collection. No mass lesion. Vascular: Please see MRA head. Skull and upper cervical spine: Normal marrow signal. Sinuses/Orbits: Normal orbits. Tiny right maxillary sinus mucous retention cyst. Trace right mastoid effusion. Other: None. MRA HEAD FINDINGS Anterior circulation: Left A1 segment hypoplasia. The bilateral Pcomms are either hypoplastic or absent. No significant stenosis, proximal occlusion, aneurysm, or vascular malformation. Posterior circulation: Dominant right vertebral artery. Dominant right AICA. No significant stenosis, proximal occlusion, aneurysm, or vascular malformation. Venous sinuses: No evidence of thrombosis. Anatomic variants: As detailed above. MRA NECK FINDINGS There is no high-grade narrowing or focal aneurysm involving the bilateral carotid arteries. No evidence of dissection. Retropharyngeal  right bifurcation and right common carotid artery. Retropharyngeal bilateral proximal ECAs and ICAs. The bilateral vertebral arteries are patent and demonstrate antegrade flow. Dominant right vertebral artery. No evidence of high-grade narrowing or focal aneurysm. Three vessel aortic arch. IMPRESSION: No acute intracranial process. Sequela of remote bilateral frontal and left temporal insults. Left frontal superficial siderosis. No evidence of occlusion, high-grade narrowing, dissection or aneurysm involving the major intracranial or neck vessels. Electronically Signed   By: Stana Bunting M.D.   On: 08/22/2020 10:11   MR ANGIO NECK W WO CONTRAST  Result Date: 08/22/2020 CLINICAL DATA:  Ataxia, neuro deficit, acute. EXAM: MRI HEAD WITHOUT CONTRAST MRA HEAD WITHOUT CONTRAST MRA NECK WITHOUT AND WITH CONTRAST TECHNIQUE: Multiplanar, multiecho pulse sequences of the brain and surrounding structures were obtained without intravenous contrast. Angiographic images of the Circle of Willis were obtained using MRA technique without intravenous contrast.  Angiographic images of the neck were obtained using MRA technique without and with intravenous contrast. Carotid stenosis measurements (when applicable) are obtained utilizing NASCET criteria, using the distal internal carotid diameter as the denominator. CONTRAST:  88mL GADAVIST GADOBUTROL 1 MMOL/ML IV SOLN COMPARISON:  08/21/2020 CT head code stroke. FINDINGS: MRI HEAD FINDINGS Brain: No diffusion-weighted signal abnormality. Sequela of prior bilateral frontal and left temporal insults with superficial siderosis. No acute intracranial hemorrhage. Mild cerebral atrophy with ex vacuo dilatation. Minimal chronic microvascular ischemic changes. No midline shift, ventriculomegaly or extra-axial fluid collection. No mass lesion. Vascular: Please see MRA head. Skull and upper cervical spine: Normal marrow signal. Sinuses/Orbits: Normal orbits. Tiny right maxillary sinus mucous retention cyst. Trace right mastoid effusion. Other: None. MRA HEAD FINDINGS Anterior circulation: Left A1 segment hypoplasia. The bilateral Pcomms are either hypoplastic or absent. No significant stenosis, proximal occlusion, aneurysm, or vascular malformation. Posterior circulation: Dominant right vertebral artery. Dominant right AICA. No significant stenosis, proximal occlusion, aneurysm, or vascular malformation. Venous sinuses: No evidence of thrombosis. Anatomic variants: As detailed above. MRA NECK FINDINGS There is no high-grade narrowing or focal aneurysm involving the bilateral carotid arteries. No evidence of dissection. Retropharyngeal right bifurcation and right common carotid artery. Retropharyngeal bilateral proximal ECAs and ICAs. The bilateral vertebral arteries are patent and demonstrate antegrade flow. Dominant right vertebral artery. No evidence of high-grade narrowing or focal aneurysm. Three vessel aortic arch. IMPRESSION: No acute intracranial process. Sequela of remote bilateral frontal and left temporal insults. Left frontal  superficial siderosis. No evidence of occlusion, high-grade narrowing, dissection or aneurysm involving the major intracranial or neck vessels. Electronically Signed   By: Stana Bunting M.D.   On: 08/22/2020 10:11   MR BRAIN WO CONTRAST  Result Date: 08/22/2020 CLINICAL DATA:  Ataxia, neuro deficit, acute. EXAM: MRI HEAD WITHOUT CONTRAST MRA HEAD WITHOUT CONTRAST MRA NECK WITHOUT AND WITH CONTRAST TECHNIQUE: Multiplanar, multiecho pulse sequences of the brain and surrounding structures were obtained without intravenous contrast. Angiographic images of the Circle of Willis were obtained using MRA technique without intravenous contrast. Angiographic images of the neck were obtained using MRA technique without and with intravenous contrast. Carotid stenosis measurements (when applicable) are obtained utilizing NASCET criteria, using the distal internal carotid diameter as the denominator. CONTRAST:  30mL GADAVIST GADOBUTROL 1 MMOL/ML IV SOLN COMPARISON:  08/21/2020 CT head code stroke. FINDINGS: MRI HEAD FINDINGS Brain: No diffusion-weighted signal abnormality. Sequela of prior bilateral frontal and left temporal insults with superficial siderosis. No acute intracranial hemorrhage. Mild cerebral atrophy with ex vacuo dilatation. Minimal chronic microvascular ischemic changes. No midline shift, ventriculomegaly or extra-axial  fluid collection. No mass lesion. Vascular: Please see MRA head. Skull and upper cervical spine: Normal marrow signal. Sinuses/Orbits: Normal orbits. Tiny right maxillary sinus mucous retention cyst. Trace right mastoid effusion. Other: None. MRA HEAD FINDINGS Anterior circulation: Left A1 segment hypoplasia. The bilateral Pcomms are either hypoplastic or absent. No significant stenosis, proximal occlusion, aneurysm, or vascular malformation. Posterior circulation: Dominant right vertebral artery. Dominant right AICA. No significant stenosis, proximal occlusion, aneurysm, or vascular  malformation. Venous sinuses: No evidence of thrombosis. Anatomic variants: As detailed above. MRA NECK FINDINGS There is no high-grade narrowing or focal aneurysm involving the bilateral carotid arteries. No evidence of dissection. Retropharyngeal right bifurcation and right common carotid artery. Retropharyngeal bilateral proximal ECAs and ICAs. The bilateral vertebral arteries are patent and demonstrate antegrade flow. Dominant right vertebral artery. No evidence of high-grade narrowing or focal aneurysm. Three vessel aortic arch. IMPRESSION: No acute intracranial process. Sequela of remote bilateral frontal and left temporal insults. Left frontal superficial siderosis. No evidence of occlusion, high-grade narrowing, dissection or aneurysm involving the major intracranial or neck vessels. Electronically Signed   By: Stana Buntinghikanele  Emekauwa M.D.   On: 08/22/2020 10:11   ECHOCARDIOGRAM COMPLETE  Result Date: 08/22/2020    ECHOCARDIOGRAM REPORT   Patient Name:   Kylie Wells Date of Exam: 08/22/2020 Medical Rec #:  [id number]    Height:       61.0 in Accession #:    [id number]   Weight:       173.1 lb Date of Birth:  June 03, 1966    BSA:          1.776 m Patient Age:    54 years     BP:           104/71 mmHg Patient Gender: F            HR:           75 bpm. [facility] Procedure: 2D Echo Indications:    TIA 435.9 / G45.9  History:        Patient has no prior history of Echocardiogram examinations.                 Risk Factors:Non-Smoker. Traumatic Brain Injury, Asymptomatic                 bacteriuria, Aphasia.  Sonographer:    Jeryl ColumbiaJohanna Elliott RDCS (AE) Referring Phys: [id number] DAVID MANUEL ORTIZ IMPRESSIONS  1. Left ventricular ejection fraction, by estimation, is 60 to 65%. The left ventricle has normal function. The left ventricle has no regional wall motion abnormalities. There is mild left ventricular hypertrophy. Left ventricular diastolic parameters were normal.  2. Right ventricular systolic  function is normal. The right ventricular size is normal.  3. The mitral valve is normal in structure. Trivial mitral valve regurgitation. No evidence of mitral stenosis.  4. The aortic valve is tricuspid. Aortic valve regurgitation is not visualized. No aortic stenosis is present.  5. The inferior vena cava is normal in size with greater than 50% respiratory variability, suggesting right atrial pressure of 3 mmHg. FINDINGS  Left Ventricle: Left ventricular ejection fraction, by estimation, is 60 to 65%. The left ventricle has normal function. The left ventricle has no regional wall motion abnormalities. The left ventricular internal cavity size was normal in size. There is  mild left ventricular hypertrophy. Left ventricular diastolic parameters were normal. Right Ventricle: The right ventricular size is normal. No increase in right ventricular wall thickness. Right ventricular systolic  function is normal. Left Atrium: Left atrial size was normal in size. Right Atrium: Right atrial size was normal in size. Pericardium: There is no evidence of pericardial effusion. Mitral Valve: The mitral valve is normal in structure. Trivial mitral valve regurgitation. No evidence of mitral valve stenosis. Tricuspid Valve: The tricuspid valve is normal in structure. Tricuspid valve regurgitation is not demonstrated. No evidence of tricuspid stenosis. Aortic Valve: The aortic valve is tricuspid. Aortic valve regurgitation is not visualized. No aortic stenosis is present. Aortic valve mean gradient measures 7.1 mmHg. Aortic valve peak gradient measures 12.8 mmHg. Aortic valve area, by VTI measures 1.79  cm. Pulmonic Valve: The pulmonic valve was not well visualized. Pulmonic valve regurgitation is trivial. No evidence of pulmonic stenosis. Aorta: The aortic root is normal in size and structure. Venous: The inferior vena cava is normal in size with greater than 50% respiratory variability, suggesting right atrial pressure of 3 mmHg.  IAS/Shunts: The interatrial septum was not well visualized.  LEFT VENTRICLE PLAX 2D LVIDd:         4.36 cm  Diastology LVIDs:         2.35 cm  LV e' medial:    7.40 cm/s LV PW:         1.21 cm  LV E/e' medial:  14.3 LV IVS:        1.12 cm  LV e' lateral:   10.20 cm/s LVOT diam:     1.90 cm  LV E/e' lateral: 10.4 LV SV:         73 LV SV Index:   41 LVOT Area:     2.84 cm  RIGHT VENTRICLE RV S prime:     12.00 cm/s TAPSE (M-mode): 2.6 cm LEFT ATRIUM             Index       RIGHT ATRIUM           Index LA diam:        3.70 cm 2.08 cm/m  RA Area:     13.20 cm LA Vol (A2C):   36.6 ml 20.61 ml/m RA Volume:   32.00 ml  18.02 ml/m LA Vol (A4C):   52.3 ml 29.45 ml/m LA Biplane Vol: 43.2 ml 24.32 ml/m  AORTIC VALVE AV Area (Vmax):    2.22 cm AV Area (Vmean):   1.87 cm AV Area (VTI):     1.79 cm AV Vmax:           179.17 cm/s AV Vmean:          125.693 cm/s AV VTI:            0.407 m AV Peak Grad:      12.8 mmHg AV Mean Grad:      7.1 mmHg LVOT Vmax:         140.54 cm/s LVOT Vmean:        82.913 cm/s LVOT VTI:          0.257 m LVOT/AV VTI ratio: 0.63  AORTA Ao Root diam: 2.50 cm MITRAL VALVE MV Area (PHT): 4.06 cm     SHUNTS MV Decel Time: 187 msec     Systemic VTI:  0.26 m MV E velocity: 106.00 cm/s  Systemic Diam: 1.90 cm MV A velocity: 81.40 cm/s MV E/A ratio:  1.30 Dina Rich MD Electronically signed by Dina Rich MD Signature Date/Time: 08/22/2020/4:02:43 PM    Final    CT HEAD CODE STROKE WO CONTRAST  Result Date: 08/21/2020  CLINICAL DATA:  Code stroke. Initial evaluation for acute altered mental status, confusion. EXAM: CT HEAD WITHOUT CONTRAST TECHNIQUE: Contiguous axial images were obtained from the base of the skull through the vertex without intravenous contrast. COMPARISON:  None available. FINDINGS: Brain: Age-related cerebral atrophy with mild chronic small vessel ischemic disease. Scattered areas of multifocal encephalomalacia involving the anterior left frontotemporal region most likely  related to remote trauma. No acute intracranial hemorrhage. No acute large vessel territory infarct. No mass lesion, mass effect, or midline shift. No hydrocephalus or extra-axial fluid collection. Vascular: No hyperdense vessel. Skull: Scalp soft tissues within normal limits. Calvarium intact. Postsurgical changes partially visualize within the upper cervical spine. Sinuses/Orbits: Globes and orbital soft tissues within normal limits. Paranasal sinuses are clear. No mastoid effusion. Other: None. ASPECTS [facility] Stroke Program Early CT Score) - Ganglionic level infarction (caudate, lentiform nuclei, internal capsule, insula, M1-M3 cortex): 7 - Supraganglionic infarction (M4-M6 cortex): 3 Total score (0-10 with 10 being normal): 10 IMPRESSION: 1. No acute intracranial infarct or other abnormality. 2. ASPECTS is 10. 3. Chronic left frontotemporal encephalomalacia, most likely related to remote trauma. 4. Underlying age-related cerebral atrophy with mild chronic small vessel ischemic disease. Critical Value/emergent results were called by telephone at the time of interpretation on 08/21/2020 at 9:37 pm to provider JOSEPH ZAMMIT , who verbally acknowledged these results. Electronically Signed   By: Rise Mu M.D.   On: 08/21/2020 21:39     Labs:   Basic Metabolic Panel: Recent Labs  Lab 08/23/20 0705 08/23/20 0705 08/24/20 0714 08/24/20 0714 08/26/20 0309 08/26/20 0309 08/27/20 0359 08/28/20 0629  NA 139  --  140  --  143  --  142 142  K 3.6   < > 3.6   < > 4.1   < > 4.1 4.3  CL 108  --  104  --  110  --  109 107  CO2 22  --  25  --  26  --  23 24  GLUCOSE 91  --  93  --  102*  --  95 86  BUN 11  --  10  --  8  --  9 11  CREATININE 0.50  --  0.51  --  0.58  --  0.66 0.55  CALCIUM 8.4*  --  8.6*  --  9.2  --  9.1 9.4  MG 1.9  --   --   --   --   --   --   --    < > = values in this interval not displayed.   GFR Estimated Creatinine Clearance: 81.2 mL/min (by C-G formula based on  SCr of 0.55 mg/dL). Liver Function Tests: Recent Labs  Lab 08/21/20 2135  AST 19  ALT 26  ALKPHOS 60  BILITOT 0.7  PROT 7.4  ALBUMIN 3.6   No results for input(s): LIPASE, AMYLASE in the last 168 hours. No results for input(s): AMMONIA in the last 168 hours. Coagulation profile Recent Labs  Lab 08/21/20 2135  INR 1.1    CBC: Recent Labs  Lab 08/21/20 2135 08/21/20 2135 08/21/20 2232 08/22/20 0525 08/24/20 0714 08/25/20 0458 08/26/20 0309 08/27/20 0359 08/28/20 0629  WBC 7.9   < > 8.0   < > 5.7 5.5 5.7 5.8 5.9  NEUTROABS 7.0  --  6.9  --   --   --  3.5 3.5 3.3  HGB 3.7*   < > 3.7*   < > 7.4* 7.1* 7.9* 7.8* 8.5*  HCT 15.3*   < > 16.1*   < > 26.3* 25.8* 28.3* 28.6* 31.1*  MCV 72.2*   < > 74.2*   < > 80.9 79.6* 80.4 81.3 81.2  PLT 363   < > PLATELET CLUMPS NOTED ON SMEAR, UNABLE TO ESTIMATE   < > 303 317 360 343 349   < > = values in this interval not displayed.   Cardiac Enzymes: No results for input(s): CKTOTAL, CKMB, CKMBINDEX, TROPONINI in the last 168 hours. BNP: Invalid input(s): POCBNP CBG: Recent Labs  Lab 08/21/20 2140 08/25/20 0016  GLUCAP 139* 96   D-Dimer No results for input(s): DDIMER in the last 72 hours. Hgb A1c No results for input(s): HGBA1C in the last 72 hours. Lipid Profile No results for input(s): CHOL, HDL, LDLCALC, TRIG, CHOLHDL, LDLDIRECT in the last 72 hours. Thyroid function studies No results for input(s): TSH, T4TOTAL, T3FREE, THYROIDAB in the last 72 hours.  Invalid input(s): FREET3 Anemia work up Recent Labs    08/26/20 0309  VITAMINB12 948*  FOLATE 26.7  FERRITIN 7*  TIBC 522*  IRON 19*   Microbiology Recent Results (from the past 240 hour(s))  SARS Coronavirus 2 by RT PCR (hospital order, performed in [facility] hospital lab) Nasopharyngeal Nasopharyngeal Swab     Status: None   Collection Time: 08/21/20  9:45 PM   Specimen: Nasopharyngeal Swab  Result Value Ref Range Status   SARS Coronavirus 2 NEGATIVE  NEGATIVE Final    Comment: (NOTE) SARS-CoV-2 target nucleic acids are NOT DETECTED.  The SARS-CoV-2 RNA is generally detectable in upper and lower respiratory specimens during the acute phase of infection. The lowest concentration of SARS-CoV-2 viral copies this assay can detect is 250 copies / mL. A negative result does not preclude SARS-CoV-2 infection and should not be used as the sole basis for treatment or other patient management decisions.  A negative result may occur with improper specimen collection / handling, submission of specimen other than nasopharyngeal swab, presence of viral mutation(s) within the areas targeted by this assay, and inadequate number of viral copies (<250 copies / mL). A negative result must be combined with clinical observations, patient history, and epidemiological information.  Fact Sheet for Patients:   BoilerBrush.com.cy  Fact Sheet for Healthcare Providers: https://pope.com/  This test is not yet approved or  cleared by the [place] FDA and has been authorized for detection and/or diagnosis of SARS-CoV-2 by FDA under an Emergency Use Authorization (EUA).  This EUA will remain in effect (meaning this test can be used) for the duration of the COVID-19 declaration under Section 564(b)(1) of the Act, 21 U.S.C. section 360bbb-3(b)(1), unless the authorization is terminated or revoked sooner.  Performed at [facility], [address] [place], [place] [address]   MRSA PCR Screening     Status: Abnormal   Collection Time: 08/25/20  9:51 PM   Specimen: Nasal Mucosa; Nasopharyngeal  Result Value Ref Range Status   MRSA by PCR POSITIVE (A) NEGATIVE Final    Comment:        The GeneXpert MRSA Assay (FDA approved for NASAL specimens only), is one component of a comprehensive MRSA colonization surveillance program. It is not intended to diagnose MRSA infection nor to guide or monitor treatment  for MRSA infections. RESULT CALLED TO, READ BACK BY AND VERIFIED WITH: A. ADETUTU,RN [id number] 08/26/2020 Girtha Hake Performed at [facility], [address], [place], [place] [address]      Signed: Melina Schools [place]ah Devin  [facility] 08/28/2020, 11:17 AM

## 2020-08-28 NOTE — Progress Notes (Signed)
Patient and husband at bedside was provided with discharge instructions. Both verbalized understanding. Patient belongings sent with patient. Pt to be taken downstairs in a wheelchair.

## 2020-08-28 NOTE — Progress Notes (Signed)
Physical Therapy Treatment Patient Details Name: Kylie Wells MRN: [id number] DOB: [birth year] Today's Date: 08/28/2020    History of Present Illness Kylie Wells is a 54 y.o. female with medical history significant of GERD, TBI in 2018 who was brought to the emergency department via EMS due to a possible occult stroke.  Her husband stated that he last saw her normal around 1430 earlier today, she took hydrocodone and a muscle relaxant earlier and went to sleep.  When she woke up about 4 hours later, she was very confused.  EMS stated that she was altered on their initial assessment.  The patient has been taking analgesics and muscle relaxants after having a fall over the weekend and diagnosed with musculoskeletal pain on her back.  Her husband states that she has been taking a lot of ibuprofen until recently due to chronic left foot pain.  She was scheduled to have surgery on her left foot to try to correct this issue, but this was postponed due to the COVID-19 delta variant healthcare crisis.  They deny vaginal bleed, melena or hematemesis.  The husband stated that she has been sleeping more than usual.  He also noticed that her systolic BP was in the low 100s when she was at the urgent care, which is unusual for her.  No fever, chills, headache, sore throat, rhinorrhea, wheezing or hemoptysis.  No chest pain, palpitations, diaphoresis, PND, orthopnea or recent pitting edema of the lower extremities.  No abdominal pain, diarrhea, constipation, melena or hematochezia.  No dysuria, frequency or hematuria.  Denies polyuria, polydipsia, polyphagia or blurred vision.    PT Comments    Pt progressing well with post-op mobility. She was able to demonstrate transfers and ambulation with gross supervision for safety to modified independence and RW for support. Pt was educated on precautions, positioning recommendations, appropriate activity progression, and car transfer. Both pt and husband anticipate d/c home  this afternoon. Will continue to follow.     Follow Up Recommendations  No PT follow up;Supervision for mobility/OOB     Equipment Recommendations  None recommended by PT    Recommendations for Other Services       Precautions / Restrictions Precautions Precautions: Fall;Back Precaution Booklet Issued: Yes (comment) Precaution Comments: Back precautions for comfort Restrictions Weight Bearing Restrictions: No    Mobility  Bed Mobility Overal bed mobility: Modified Independent Bed Mobility: Rolling;Sidelying to Sit;Sit to Sidelying Rolling: Modified independent (Device/Increase time) Sidelying to sit: Modified independent (Device/Increase time)     Sit to sidelying: Modified independent (Device/Increase time) General bed mobility comments: Pt able to demonstrate log roll without cues. Min use of rails for support.   Transfers Overall transfer level: Needs assistance Equipment used: Rolling walker (2 wheeled) Transfers: Sit to/from [employer] Sit to Stand: Supervision         General transfer comment: Close supervision for safety. No assist required.   Ambulation/Gait Ambulation/Gait assistance: Supervision;Min guard Gait Distance (Feet): 200 Feet Assistive device: Rolling walker (2 wheeled) Gait Pattern/deviations: Decreased stride length;Wide base of support;Step-through pattern;Decreased step length - left;Antalgic;Decreased weight shift to left Gait velocity: decreased Gait velocity interpretation: <1.8 ft/sec, indicate of risk for recurrent falls General Gait Details: Min guard progressing to supervision for safety with RW for support. Pt with mildly antalgic gait pattern (baseline). She reports pain that is unchanged with ambulation and states it is "different", thinking the bed is making her back ache.    Stairs Stairs: Yes Stairs assistance: Min guard Stair Management: Two  rails;Step to pattern;Forwards Number of Stairs: 5 General stair  comments: Practice stairs in rehab gym. Pt was able to negotiate with VC's for sequencing and general safety, but no assist required.    Wheelchair Mobility    Modified Rankin (Stroke Patients Only)       Balance Overall balance assessment: Needs assistance Sitting-balance support: Feet supported;No upper extremity supported Sitting balance-Leahy Scale: Good Sitting balance - Comments: seated at EOB   Standing balance support: During functional activity;No upper extremity supported Standing balance-Leahy Scale: Poor Standing balance comment: fair/poor without AD, fair/good using RW                            Cognition Arousal/Alertness: Awake/alert Behavior During Therapy: WFL for tasks assessed/performed Overall Cognitive Status: Impaired/Different from baseline Area of Impairment: Memory;Safety/judgement                     Memory: Decreased short-term memory;Decreased recall of precautions   Safety/Judgement: Decreased awareness of safety;Decreased awareness of deficits     General Comments: History of TBI. Husband assists her with memory accuracy.       Exercises      General Comments        Pertinent Vitals/Pain Pain Assessment: 0-10 Pain Score: 7  Pain Location: low back Pain Descriptors / Indicators: Sore;Aching Pain Intervention(s): Limited activity within patient's tolerance;Monitored during session;Repositioned    Home Living                      Prior Function            PT Goals (current goals can now be found in the care plan section) Acute Rehab PT Goals Patient Stated Goal: return home with family to assist PT Goal Formulation: With patient/family Time For Goal Achievement: 09/02/20 Potential to Achieve Goals: Good Progress towards PT goals: Progressing toward goals    Frequency    Min 3X/week      PT Plan Current plan remains appropriate    Co-evaluation              AM-PAC PT "6 Clicks"  Mobility   Outcome Measure  Help needed turning from your back to your side while in a flat bed without using bedrails?: None Help needed moving from lying on your back to sitting on the side of a flat bed without using bedrails?: None Help needed moving to and from a bed to a chair (including a wheelchair)?: None Help needed standing up from a chair using your arms (e.g., wheelchair or bedside chair)?: None Help needed to walk in hospital room?: None Help needed climbing 3-5 steps with a railing? : None 6 Click Score: 24    End of Session Equipment Utilized During Treatment: Gait belt Activity Tolerance: Patient tolerated treatment well Patient left: in chair;with call bell/phone within reach;with family/visitor present Nurse Communication: Mobility status PT Visit Diagnosis: Unsteadiness on feet (R26.81);Other abnormalities of gait and mobility (R26.89);Muscle weakness (generalized) (M62.81)     Time: 4917-9150 PT Time Calculation (min) (ACUTE ONLY): 22 min  Charges:  $Gait Training: 8-22 mins                     Conni Slipper, PT, DPT Acute Rehabilitation Services Pager: [phone] Office: [phone]    Marylynn Pearson 08/28/2020, 2:03 PM

## 2020-08-28 NOTE — Plan of Care (Signed)
  Problem: Education: Goal: Knowledge of General Education information will improve Description: Including pain rating scale, medication(s)/side effects and non-pharmacologic comfort measures Outcome: Adequate for Discharge   Problem: Health Behavior/Discharge Planning: Goal: Ability to manage health-related needs will improve Outcome: Adequate for Discharge   Problem: Clinical Measurements: Goal: Ability to maintain clinical measurements within normal limits will improve Outcome: Adequate for Discharge Goal: Will remain free from infection Outcome: Adequate for Discharge Goal: Diagnostic test results will improve Outcome: Adequate for Discharge Goal: Respiratory complications will improve Outcome: Adequate for Discharge Goal: Cardiovascular complication will be avoided Outcome: Adequate for Discharge   Problem: Activity: Goal: Risk for activity intolerance will decrease Outcome: Adequate for Discharge   Problem: Nutrition: Goal: Adequate nutrition will be maintained Outcome: Adequate for Discharge   Problem: Coping: Goal: Level of anxiety will decrease Outcome: Adequate for Discharge   Problem: Elimination: Goal: Will not experience complications related to bowel motility Outcome: Adequate for Discharge Goal: Will not experience complications related to urinary retention Outcome: Adequate for Discharge   Problem: Pain Managment: Goal: General experience of comfort will improve Outcome: Adequate for Discharge   Problem: Safety: Goal: Ability to remain free from injury will improve Outcome: Adequate for Discharge   Problem: Skin Integrity: Goal: Risk for impaired skin integrity will decrease Outcome: Adequate for Discharge   Problem: Education: Goal: Knowledge of disease or condition will improve Outcome: Adequate for Discharge Goal: Knowledge of secondary prevention will improve Outcome: Adequate for Discharge Goal: Knowledge of patient specific risk factors  addressed and post discharge goals established will improve Outcome: Adequate for Discharge   Problem: Coping: Goal: Will verbalize positive feelings about self Outcome: Adequate for Discharge   Problem: Health Behavior/Discharge Planning: Goal: Ability to manage health-related needs will improve Outcome: Adequate for Discharge   Problem: Self-Care: Goal: Ability to participate in self-care as condition permits will improve Outcome: Adequate for Discharge Goal: Verbalization of feelings and concerns over difficulty with self-care will improve Outcome: Adequate for Discharge Goal: Ability to communicate needs accurately will improve Outcome: Adequate for Discharge   Problem: Nutrition: Goal: Risk of aspiration will decrease Outcome: Adequate for Discharge Goal: Dietary intake will improve Outcome: Adequate for Discharge

## 2020-09-19 ENCOUNTER — Inpatient Hospital Stay: Payer: Medicare HMO | Admitting: Physician Assistant

## 2020-10-02 NOTE — Progress Notes (Signed)
Patient ID: Kylie Wells, female   DOB: 21-Jun-1966, 54 y.o.   MRN: [id number]      Kylie Wells, is a 54 y.o. female  DUK:[id number]  BJS:[id number]  DOB - Jul 31, 1966  Subjective:  Chief Complaint and HPI: Kylie Wells is a 54 y.o. female here today for a follow up visit after hospitalization 9/22-9/29/2021 for a fall and fracture of T11.  She and her husband are here in [place] for what was supposed to be a vacation.  They have a PCP in [place] but can go back until she has a procedure on 11/12 then subsequent f/up from that.  She was found to have a Hgb of 3.7 and is s/p transfusion.  She is compliant with taking iron daily and will see her GI when she returns to [place] after thanksgiving.  Her pain is much improved.  She is taking tylenol for any discomfort she has.  From discharge summary:  Kylie Cooperis a 54 y.o.femalewith PMH of GERD, TBI in 2018 and chronic left foot pain on Advil She was brought to the ED at [facility] on 9/22 for altered mental status. Evidently she had a fall few days ago, sustained excruciating lower back pain and took more Advil and muscle relaxants.  9/22, she went to an urgent care sent and was started on prednisone taper. She became more confused and was hence brought to the ED.  In the ED, her hemoglobin was low at 3.7. She was given 3 units of PRBC. She was admitted to hospitalist service. MRI brain was obtained for altered mental status which did not show any evidence of a stroke. 9/24, she underwent EGD that showed mild gastritis only. Her hemoglobin level is stabilized 9/25, she was transferred to [facility] for MRI of her back and possible need kyphoplasty.  Acute T11 compression fracture -Secondary to a fall. -Underwentkyphoplasty by IR on 9/28.  Pain is improving.. -Continue tramadol at home.  Avoid NSAIDs. -PT eval obtained.  No PT follow-up recommended.  Severe acute blood loss anemia Severe iron deficiency anemia Likely  chronic GI bleeding. History of GERD -Patient was on Motrin for a long time. She probably had chronic GI bleeding. She did not have symptoms related to anemia at presentation however her hemoglobin was low at 3.7. -3 units PRBCs given.  Hemoglobin improved eventually, 8.5 today. -9/24, EGD showed mild gastritis. Continue PPI. May need colonoscopy in capsule endoscopy as an outpatient. -Anemia studies with ferritin low at 7. 1 unit of Feraheme to be given on 9/27. -Oral iron pills at discharge. -Continue Protonix. -Follow-up with GI as an outpatient. Recent Labs (within last 365 days)            Recent Labs    08/24/20 0714 08/24/20 0714 08/25/20 0458 08/25/20 0458 08/26/20 0309 08/26/20 0309 08/27/20 0359 08/28/20 0629  HGB 7.4*  --  7.1*  --  7.9*  --  7.8* 8.5*  MCV 80.9   < > 79.6*   < > 80.4   < > 81.3 81.2  VITAMINB12  --   --   --   --  948*  --   --   --   FOLATE  --   --   --   --  26.7  --   --   --   FERRITIN  --   --   --   --  7*  --   --   --   TIBC  --   --   --   --  522*  --   --   --   IRON  --   --   --   --  19*  --   --   --    < > = values in this interval not displayed.     Acute toxic metabolic encephalopathy -Presented with altered mental status, likely secondary to muscle relaxant and opioids  -MRI studies negative for CVA -2D echocardiogram with no acute findings -Mental status is back to normal.  Stable for discharge to home today   ED/Hospital notes reviewed.   Social History: Family history:  ROS:   Constitutional:  No f/c, No night sweats, No unexplained weight loss. EENT:  No vision changes, No blurry vision, No hearing changes. No mouth, throat, or ear problems.  Respiratory: No cough, No SOB Cardiac: No CP, no palpitations GI:  No abd pain, No N/V/D. GU: No Urinary s/sx Musculoskeletal: No joint pain Neuro: No headache, no dizziness, no motor weakness.  Skin: No rash Endocrine:  No polydipsia. No polyuria.  Psych: Denies  SI/HI  No problems updated.  ALLERGIES: Allergies  Allergen Reactions   Shellfish Allergy    Sulfa Antibiotics     PAST MEDICAL HISTORY: Past Medical History:  Diagnosis Date   Class 1 obesity 08/22/2020   GERD (gastroesophageal reflux disease)    TBI (traumatic brain injury) (HCC) 08/21/2020    MEDICATIONS AT HOME: Prior to Admission medications   Medication Sig Start Date End Date Taking? Authorizing Provider  pantoprazole (PROTONIX) 40 MG tablet Take 40 mg by mouth daily.     Yes [provider]  vitamin B-12 (CYANOCOBALAMIN) 500 MCG tablet Take 500 mcg by mouth daily.     Yes [provider]  ferrous sulfate 325 (65 FE) MG tablet Take 1 tablet (325 mg total) by mouth daily. 08/28/20 09/27/20  Lorin Glass, MD     Objective:  EXAM:   Vitals:   10/03/20 1050  BP: 120/77  Pulse: 79  Temp: 97.7 F (36.5 C)  TempSrc: Temporal  SpO2: 98%  Weight: 183 lb (83 kg)  Height: 5\' 1"  (1.549 m)    General appearance : A&OX3. NAD. Non-toxic-appearing;  Walks with her cane.  Her husband is with her HEENT: Atraumatic and Normocephalic.  PERRLA. EOM intact.   Chest/Lungs:  Breathing-non-labored, Good air entry bilaterally, breath sounds normal without rales, rhonchi, or wheezing  CVS: S1 S2 regular, no murmurs, gallops, rubs  Abdomen: Bowel sounds present, Non tender and not distended with no gaurding, rigidity or rebound. Extremities: Bilateral Lower Ext shows no edema, both legs are warm to touch with = pulse throughout Neurology:  CN II-XII grossly intact, Non focal.   Psych:  TP linear. J/I WNL. Normal speech. Appropriate eye contact and affect.  Skin:  No Rash  Data Review Lab Results  Component Value Date   HGBA1C 5.0 08/21/2020     Assessment & Plan   1. Chronic GI bleeding She will follow with GI in 08/23/2020.  All testing here did not reveal blood loss. - CBC with Differential/Platelet - Iron, TIBC and Ferritin Panel  2. Compression  fracture of T11 vertebra with routine healing, subsequent encounter Doing well  3. Microcytic anemia Continue iron - CBC with Differential/Platelet - Iron, TIBC and Ferritin Panel  4. Hospital discharge follow-up Doing well.   Patient have been counseled extensively about nutrition and exercise  Return for make an appt with you PCP in [place] in 4 to 6 weeks.  The patient  was given clear instructions to go to ER or return to medical center if symptoms don't improve, worsen or new problems develop. The patient verbalized understanding. The patient was told to call to get lab results if they haven't heard anything in the next week.     Georgian Co, PA-C [facility] [place], [place] [phone]   10/03/2020, 11:17 AM

## 2020-10-03 ENCOUNTER — Ambulatory Visit: Payer: Medicare HMO | Attending: Physician Assistant | Admitting: Physician Assistant

## 2020-10-03 ENCOUNTER — Other Ambulatory Visit: Payer: Self-pay

## 2020-10-03 ENCOUNTER — Encounter: Payer: Self-pay | Admitting: Physician Assistant

## 2020-10-03 VITALS — BP 120/77 | HR 79 | Temp 97.7°F | Ht 61.0 in | Wt 183.0 lb

## 2020-10-03 DIAGNOSIS — Z09 Encounter for follow-up examination after completed treatment for conditions other than malignant neoplasm: Secondary | ICD-10-CM

## 2020-10-03 DIAGNOSIS — D509 Iron deficiency anemia, unspecified: Secondary | ICD-10-CM | POA: Diagnosis not present

## 2020-10-03 DIAGNOSIS — S22080D Wedge compression fracture of T11-T12 vertebra, subsequent encounter for fracture with routine healing: Secondary | ICD-10-CM | POA: Diagnosis not present

## 2020-10-03 DIAGNOSIS — K922 Gastrointestinal hemorrhage, unspecified: Secondary | ICD-10-CM | POA: Diagnosis not present

## 2020-10-03 NOTE — Patient Instructions (Signed)
Pre-procedure Diagnoses  Thoracic compression fracture, sequela [S22.000S]  Post-procedure Diagnoses  Thoracic compression fracture, sequela [S22.000S]  Procedures  IR VERTEBROPLASTY CERV/THOR BX INC UNI/BIL INC/INJECT/IMAGING [MYT1173 (Custom)]     You can copy and paste this into a message to your PCP and have them schedule a follow-up with an interventional radiologist in [place].  You also need to schedule an appt with your gastroenterologist to assess the severe anemia that was found

## 2020-10-04 LAB — CBC WITH DIFFERENTIAL/PLATELET
Basophils Absolute: 0 10*3/uL (ref 0.0–0.2)
Basos: 1 %
EOS (ABSOLUTE): 0.1 10*3/uL (ref 0.0–0.4)
Eos: 1 %
Hematocrit: 40.7 % (ref 34.0–46.6)
Hemoglobin: 13.1 g/dL (ref 11.1–15.9)
Immature Grans (Abs): 0 10*3/uL (ref 0.0–0.1)
Immature Granulocytes: 0 %
Lymphocytes Absolute: 1.5 10*3/uL (ref 0.7–3.1)
Lymphs: 20 %
MCH: 29.6 pg (ref 26.6–33.0)
MCHC: 32.2 g/dL (ref 31.5–35.7)
MCV: 92 fL (ref 79–97)
Monocytes Absolute: 0.4 10*3/uL (ref 0.1–0.9)
Monocytes: 6 %
Neutrophils Absolute: 5.2 10*3/uL (ref 1.4–7.0)
Neutrophils: 72 %
Platelets: 167 10*3/uL (ref 150–450)
RBC: 4.43 x10E6/uL (ref 3.77–5.28)
RDW: 20.5 % — ABNORMAL HIGH (ref 11.7–15.4)
WBC: 7.2 10*3/uL (ref 3.4–10.8)

## 2020-10-04 LAB — IRON,TIBC AND FERRITIN PANEL
Ferritin: 49 ng/mL (ref 15–150)
Iron Saturation: 24 % (ref 15–55)
Iron: 102 ug/dL (ref 27–159)
Total Iron Binding Capacity: 430 ug/dL (ref 250–450)
UIBC: 328 ug/dL (ref 131–425)

## 2020-10-07 ENCOUNTER — Telehealth: Payer: Self-pay

## 2020-10-07 NOTE — Telephone Encounter (Signed)
Wandra Mannan w/ [facility] is calling on behalf of Laurence Aly, FNP-C requesting copy of patient's most recent CBC/iron studies, pt to have procedure there and reported to them that she had recent labs completed, they need results to keep from duplicating labs, fax info taken, will fax requested results, okay given from pt during call placed to discuss lab results.

## 2020-10-07 NOTE — Telephone Encounter (Signed)
Pt aware of results and voiced understanding, all questions answered/concerns addressed.
